# Patient Record
Sex: Female | Born: 1985 | Race: Black or African American | Hispanic: No | Marital: Married | Smoking: Never smoker
Health system: Southern US, Community
[De-identification: ages and names within clinical notes are randomized; demographics above are authoritative.]

## PROBLEM LIST (undated history)

## (undated) ENCOUNTER — Inpatient Hospital Stay (HOSPITAL_COMMUNITY): Payer: Self-pay

## (undated) DIAGNOSIS — R87629 Unspecified abnormal cytological findings in specimens from vagina: Secondary | ICD-10-CM

## (undated) DIAGNOSIS — J45909 Unspecified asthma, uncomplicated: Secondary | ICD-10-CM

## (undated) DIAGNOSIS — I10 Essential (primary) hypertension: Secondary | ICD-10-CM

## (undated) HISTORY — PX: TOOTH EXTRACTION: SUR596

---

## 2014-01-12 HISTORY — PX: COLPOSCOPY: SHX161

## 2014-09-11 DIAGNOSIS — I10 Essential (primary) hypertension: Secondary | ICD-10-CM | POA: Insufficient documentation

## 2014-11-27 DIAGNOSIS — D069 Carcinoma in situ of cervix, unspecified: Secondary | ICD-10-CM | POA: Insufficient documentation

## 2015-10-31 DIAGNOSIS — J4530 Mild persistent asthma, uncomplicated: Secondary | ICD-10-CM | POA: Insufficient documentation

## 2016-11-10 DIAGNOSIS — O0990 Supervision of high risk pregnancy, unspecified, unspecified trimester: Secondary | ICD-10-CM | POA: Insufficient documentation

## 2016-11-29 ENCOUNTER — Encounter (HOSPITAL_COMMUNITY): Payer: Self-pay | Admitting: Emergency Medicine

## 2016-11-29 ENCOUNTER — Emergency Department (HOSPITAL_COMMUNITY)
Admission: EM | Admit: 2016-11-29 | Discharge: 2016-11-29 | Disposition: A | Discharge status: Home / Self Care | Payer: Medicaid Other | Attending: Emergency Medicine | Admitting: Emergency Medicine

## 2016-11-29 ENCOUNTER — Other Ambulatory Visit: Payer: Self-pay

## 2016-11-29 DIAGNOSIS — O209 Hemorrhage in early pregnancy, unspecified: Secondary | ICD-10-CM | POA: Insufficient documentation

## 2016-11-29 DIAGNOSIS — O200 Threatened abortion: Secondary | ICD-10-CM

## 2016-11-29 DIAGNOSIS — Z3A12 12 weeks gestation of pregnancy: Secondary | ICD-10-CM | POA: Diagnosis not present

## 2016-11-29 DIAGNOSIS — Z79899 Other long term (current) drug therapy: Secondary | ICD-10-CM | POA: Diagnosis not present

## 2016-11-29 LAB — CBC WITH DIFFERENTIAL/PLATELET
BASOS PCT: 0 %
Basophils Absolute: 0 10*3/uL (ref 0.0–0.1)
EOS ABS: 0.1 10*3/uL (ref 0.0–0.7)
EOS PCT: 1 %
HCT: 38.6 % (ref 36.0–46.0)
HEMOGLOBIN: 13.5 g/dL (ref 12.0–15.0)
Lymphocytes Relative: 21 %
Lymphs Abs: 1.6 10*3/uL (ref 0.7–4.0)
MCH: 31.1 pg (ref 26.0–34.0)
MCHC: 35 g/dL (ref 30.0–36.0)
MCV: 88.9 fL (ref 78.0–100.0)
Monocytes Absolute: 0.9 10*3/uL (ref 0.1–1.0)
Monocytes Relative: 12 %
NEUTROS PCT: 66 %
Neutro Abs: 5.1 10*3/uL (ref 1.7–7.7)
PLATELETS: 270 10*3/uL (ref 150–400)
RBC: 4.34 MIL/uL (ref 3.87–5.11)
RDW: 13.4 % (ref 11.5–15.5)
WBC: 7.6 10*3/uL (ref 4.0–10.5)

## 2016-11-29 LAB — COMPREHENSIVE METABOLIC PANEL
ALBUMIN: 3.8 g/dL (ref 3.5–5.0)
ALK PHOS: 51 U/L (ref 38–126)
ALT: 19 U/L (ref 14–54)
ANION GAP: 7 (ref 5–15)
AST: 57 U/L — ABNORMAL HIGH (ref 15–41)
BUN: 7 mg/dL (ref 6–20)
CALCIUM: 9.2 mg/dL (ref 8.9–10.3)
CHLORIDE: 105 mmol/L (ref 101–111)
CO2: 21 mmol/L — AB (ref 22–32)
Creatinine, Ser: 0.99 mg/dL (ref 0.44–1.00)
GFR calc Af Amer: >60 mL/min (ref >60)
GFR calc non Af Amer: >60 mL/min (ref >60)
GLUCOSE: 78 mg/dL (ref 65–99)
Potassium: 5.5 mmol/L — ABNORMAL HIGH (ref 3.5–5.1)
SODIUM: 133 mmol/L — AB (ref 135–145)
Total Bilirubin: 1.2 mg/dL (ref 0.3–1.2)
Total Protein: 7.2 g/dL (ref 6.5–8.1)

## 2016-11-29 LAB — URINALYSIS, ROUTINE W REFLEX MICROSCOPIC
BILIRUBIN URINE: NEGATIVE
GLUCOSE, UA: NEGATIVE mg/dL
Ketones, ur: NEGATIVE mg/dL
LEUKOCYTES UA: NEGATIVE
NITRITE: NEGATIVE
PH: 6 (ref 5.0–8.0)
Protein, ur: 30 mg/dL — AB
SPECIFIC GRAVITY, URINE: 1.018 (ref 1.005–1.030)

## 2016-11-29 LAB — HCG, QUANTITATIVE, PREGNANCY: HCG, BETA CHAIN, QUANT, S: 110461 m[IU]/mL — AB (ref <5)

## 2016-11-29 LAB — I-STAT BETA HCG BLOOD, ED (MC, WL, AP ONLY): I-stat hCG, quantitative: >2000 m[IU]/mL — ABNORMAL HIGH (ref <5)

## 2016-11-29 MED ORDER — SODIUM CHLORIDE 0.9 % IV BOLUS (SEPSIS)
1000.0000 mL | Freq: Once | INTRAVENOUS | Status: AC
  Administered 2016-11-29: 1000 mL via INTRAVENOUS

## 2016-11-29 NOTE — Discharge Instructions (Signed)
Stay hydrated.   No sex until your bleeding stops.   Your HCG level is 110,461.   You need to see your OB doctor in 2 days for repeat HCG level   Return to ER if you have uncontrolled bleeding with clots, tissues, severe abdominal cramps.

## 2016-11-29 NOTE — ED Triage Notes (Signed)
Pt states she is [redacted] weeks pregnant and this evening when she went to the restroom she noticed she was having some vaginal bleeding  Pt states it was on the tissue when she wiped  Pt states she noticed it again here  States she had some pain in her back and cramping earlier but denies any now

## 2016-11-29 NOTE — ED Notes (Signed)
Pt has returned from restroom. Pt states "there is still blood". Pt was asked if  presently more than before, pt stated "it's the same".

## 2016-11-29 NOTE — ED Notes (Signed)
ED Provider at bedside. 

## 2016-11-29 NOTE — ED Provider Notes (Signed)
[name] DEPT Provider Note   CSN: [id number] Arrival date & time: 11/29/16  1829     History   Chief Complaint Chief Complaint  Patient presents with  . Vaginal Bleeding    HPI Laura Gould is a 31 y.o. female G1P0 [redacted] weeks pregnant here with vaginal bleeding.  Patient had spotting earlier today that resolved.  Patient states that she has been followed with OB at [facility] and has prenatal care and had normal OB US recently.   The history is provided by the patient.    History reviewed. No pertinent past medical history.  There are no active problems to display for this patient.   History reviewed. No pertinent surgical history.  OB History    Gravida Para Term Preterm AB Living   1             SAB TAB Ectopic Multiple Live Births                   Home Medications    Prior to Admission medications   Medication Sig Start Date End Date Taking? Authorizing Provider  acetaminophen (TYLENOL) 500 MG tablet Take 500 mg every 6 (six) hours as needed by mouth for moderate pain or headache.   Yes [provider]  albuterol (PROVENTIL HFA;VENTOLIN HFA) 108 (90 Base) MCG/ACT inhaler Inhale 1 puff every 6 (six) hours as needed into the lungs for wheezing or shortness of breath.   Yes [provider]  Prenatal Multivit-Min-Fe-FA (PRENATAL VITAMINS PO) Take 1 tablet daily by mouth.   Yes [provider]    Family History History reviewed. No pertinent family history.  Social History Social History   Tobacco Use  . Smoking status: Never Smoker  . Smokeless tobacco: Never Used  Substance Use Topics  . Alcohol use: No    Frequency: Never  . Drug use: No     Allergies   Patient has no known allergies.   Review of Systems Review of Systems  Genitourinary: Positive for vaginal bleeding.  All other systems reviewed and are negative.    Physical Exam Updated Vital Signs BP 133/87 (BP Location: Left  Arm)   Pulse 76   Temp 99 F (37.2 C) (Oral)   Resp 16   Ht 4\' 11"  (1.499 m)   Wt 70.3 kg (155 lb)   SpO2 100%   BMI 31.31 kg/m   Physical Exam  Constitutional: She is oriented to person, place, and time. She appears well-developed.  HENT:  Head: Normocephalic.  Mouth/Throat: Oropharynx is clear and moist.  Eyes: Conjunctivae and EOM are normal. Pupils are equal, round, and reactive to light.  Neck: Normal range of motion. Neck supple.  Cardiovascular: Normal rate, regular rhythm and normal heart sounds.  Pulmonary/Chest: Effort normal and breath sounds normal. No stridor. No respiratory distress. She has no wheezes.  Abdominal: Soft. Bowel sounds are normal.  Gravid uterus, nontender   Genitourinary:  Genitourinary Comments: Os closed, no CMT or adnexal or uterine tenderness   Musculoskeletal: Normal range of motion.  Neurological: She is alert and oriented to person, place, and time.  Skin: Skin is warm.  Psychiatric: She has a normal mood and affect.  Nursing note and vitals reviewed.    ED Treatments / Results  Labs (all labs ordered are listed, but only abnormal results are displayed) Labs Reviewed  COMPREHENSIVE METABOLIC PANEL - Abnormal; Notable for the following components:      Result Value  Sodium 133 (*)    Potassium 5.5 (*)    CO2 21 (*)    AST 57 (*)    All other components within normal limits  HCG, QUANTITATIVE, PREGNANCY - Abnormal; Notable for the following components:   hCG, Beta Chain, Quant, S 110,461 (*)    All other components within normal limits  URINALYSIS, ROUTINE W REFLEX MICROSCOPIC - Abnormal; Notable for the following components:   Color, Urine AMBER (*)    Hgb urine dipstick LARGE (*)    Protein, ur 30 (*)    Bacteria, UA RARE (*)    Squamous Epithelial / LPF 0-5 (*)    All other components within normal limits  I-STAT BETA HCG BLOOD, ED ([facility], [facility], [facility] ONLY) - Abnormal; Notable for the following components:   I-stat hCG, quantitative  >2,000.0 (*)    All other components within normal limits  CBC WITH DIFFERENTIAL/PLATELET    EKG  EKG Interpretation None       Radiology No results found.  Procedures Procedures (including critical care time)  EMERGENCY DEPARTMENT [place] PREGNANCY "Study: Limited Ultrasound of the Pelvis for Pregnancy"  INDICATIONS:Pregnancy(required) Multiple views of the uterus and pelvic cavity were obtained in real-time with a multi-frequency probe.  APPROACH:Transabdominal  PERFORMED BY: Myself IMAGES ARCHIVED?: Yes LIMITATIONS: Body habitus PREGNANCY FREE FLUID: None ADNEXAL FINDINGS:Left ovary not seen and Right ovary not seen GESTATIONAL AGE, ESTIMATE: 12 week 4 days FETAL HEART RATE: 164 INTERPRETATION: Intrauterine gestational sac noted, Yolk sac noted and Fetal pole present       Medications Ordered in ED Medications  sodium chloride 0.9 % bolus 1,000 mL (0 mLs Intravenous Stopped 11/29/16 2252)     Initial Impression / Assessment and Plan / ED Course  I have reviewed the triage vital signs and the nursing notes.  Pertinent labs & imaging results that were available during my care of the patient were reviewed by me and considered in my medical decision making (see chart for details).    Laura Gould is a 31 y.o. female here with vaginal bleeding. [redacted] weeks pregnant, bedside US showed nl FHR and good movement. Her blood type is B positive so doesn't need Rhogam. Will check CBC, HCG.   11:35 PM HCG is 110,000. Os is closed on exam. Likely threatened abortion in first trimester pregnancy. Patient's Hg stable. Expect some spotting for several days. Recommend pelvic rest. She needs to get repeat HCG at [facility] office in 2 days. Gave strict return precautions    Final Clinical Impressions(s) / ED Diagnoses   Final diagnoses:  None    ED Discharge Orders    None       Drenda Freeze, MD 11/29/16 2337

## 2016-11-29 NOTE — ED Notes (Signed)
Pt wanted to use the restroom before triage.

## 2017-01-12 NOTE — L&D Delivery Note (Signed)
Operative Delivery Note At  a viable female was delivered via .  Presentation: vertex; Position: Left,, Occiput,, Anterior; Station: +4. Kiwi vac assisted x1 pull with del of fetal head, nuchal cord x 3 with occult arm, easilt reduced with spont del of rest of infant. Upon del infant had stron cry, movement of all extremities and pinked up well  Verbal consent: obtained from patient.  Risks and benefits discussed in detail.  Risks include, but are not limited to the risks of anesthesia, bleeding, infection, damage to maternal tissues, fetal cephalhematoma.  There is also the risk of inability to effect vaginal delivery of the head, or shoulder dystocia that cannot be resolved by established maneuvers, leading to the need for emergency cesarean section.  APGAR:8 ,9 ; weight  .   Placenta status:spont ,shultz .   Cord: 3vc with the following complications: PPH .  Cord pH: n/a  Anesthesia:  epidural Instruments: kiwi vac Episiotomy:  none Lacerations:  2nd repaired by Dr. Tawni Levy Repair: 3.0 vicryl Est. Blood Loss (mL):  1200  Mom to postpartum.  Baby to Couplet care / Skin to Skin.  [name] 05/16/2017, 10:09 AM

## 2017-01-14 DIAGNOSIS — O43199 Other malformation of placenta, unspecified trimester: Secondary | ICD-10-CM | POA: Insufficient documentation

## 2017-02-04 ENCOUNTER — Encounter: Payer: Self-pay | Admitting: Obstetrics and Gynecology

## 2017-02-04 ENCOUNTER — Ambulatory Visit (INDEPENDENT_AMBULATORY_CARE_PROVIDER_SITE_OTHER): Payer: Medicaid Other | Admitting: Obstetrics and Gynecology

## 2017-02-04 VITALS — BP 135/85 | HR 93 | Wt 184.0 lb

## 2017-02-04 DIAGNOSIS — Z3402 Encounter for supervision of normal first pregnancy, second trimester: Secondary | ICD-10-CM

## 2017-02-04 DIAGNOSIS — O10912 Unspecified pre-existing hypertension complicating pregnancy, second trimester: Secondary | ICD-10-CM

## 2017-02-04 DIAGNOSIS — O10919 Unspecified pre-existing hypertension complicating pregnancy, unspecified trimester: Secondary | ICD-10-CM | POA: Insufficient documentation

## 2017-02-04 MED ORDER — ASPIRIN EC 81 MG PO TBEC: 81.0000 mg | DELAYED_RELEASE_TABLET | Freq: Every day | ORAL | 2 refills | Status: DC

## 2017-02-04 MED ORDER — PRENATAL VITAMINS 0.8 MG PO TABS: 1.0000 | ORAL_TABLET | Freq: Every day | ORAL | 12 refills | Status: DC

## 2017-02-04 NOTE — Progress Notes (Signed)
Patient is in the office for initial ob visit, pt recently married in Oct., unplanned pregnancy.

## 2017-02-04 NOTE — Progress Notes (Signed)
   PRENATAL VISIT NOTE  Subjective:  Laura Gould is a 32 y.o. G1P0 at [redacted]w[redacted]d being seen today for ongoing prenatal care. Patient is recently married and relovated to [place] from [place]. Patient with prenatal care in [place] (records requested). She is currently monitored for the following issues for this high-risk pregnancy and has Adenocarcinoma in situ (AIS) of uterine cervix; Marginal insertion of umbilical cord affecting management of mother; Supervision of normal first pregnancy; Essential hypertension; Mild persistent asthma without complication; and Chronic hypertension affecting pregnancy on their problem list.  Patient reports no complaints.  Contractions: Not present. Vag. Bleeding: None.   . Denies leaking of fluid.   The following portions of the patient's history were reviewed and updated as appropriate: allergies, current medications, past family history, past medical history, past social history, past surgical history and problem list. Problem list updated.  Objective:   Vitals:   02/04/17 1015  BP: 135/85  Pulse: 93  Weight: 184 lb (83.5 kg)    Fetal Status: Fetal Heart Rate (bpm): 150 Fundal Height: 22 cm       General:  Alert, oriented and cooperative. Patient is in no acute distress.  Skin: Skin is warm and dry. No rash noted.   Cardiovascular: Normal heart rate noted  Respiratory: Normal respiratory effort, no problems with respiration noted  Abdomen: Soft, gravid, appropriate for gestational age.  Pain/Pressure: Absent     Pelvic: Cervical exam deferred        Extremities: Normal range of motion.  Edema: Trace  Mental Status:  Normal mood and affect. Normal behavior. Normal judgment and thought content.   Assessment and Plan:  Pregnancy: G1P0 at [redacted]w[redacted]d  1. Encounter for supervision of normal first pregnancy in second trimester Patient is doing well Prenatal records requested (particularly of ultrasounds)  2. Chronic hypertension affecting  pregnancy Discussed the benefits of daily ASA Will monitor BP closely for need of antihypertensive (Patient was previously on a thiazide) Will schedule growth ultrasound at 28 weeks  Preterm labor symptoms and general obstetric precautions including but not limited to vaginal bleeding, contractions, leaking of fluid and fetal movement were reviewed in detail with the patient. Please refer to After Visit Summary for other counseling recommendations.  Return in about 4 weeks (around 03/04/2017) for ROB.   Mora Bellman, MD

## 2017-02-08 ENCOUNTER — Encounter (HOSPITAL_COMMUNITY): Payer: Self-pay

## 2017-02-10 ENCOUNTER — Encounter (HOSPITAL_COMMUNITY): Payer: Self-pay

## 2017-02-10 ENCOUNTER — Other Ambulatory Visit (HOSPITAL_COMMUNITY): Payer: Self-pay | Admitting: *Deleted

## 2017-02-10 ENCOUNTER — Ambulatory Visit (HOSPITAL_COMMUNITY)
Admission: RE | Admit: 2017-02-10 | Discharge: 2017-02-10 | Disposition: A | Discharge status: Home / Self Care | Payer: Medicaid Other | Source: Ambulatory Visit | Attending: Obstetrics and Gynecology | Admitting: Obstetrics and Gynecology

## 2017-02-10 DIAGNOSIS — D251 Intramural leiomyoma of uterus: Secondary | ICD-10-CM | POA: Insufficient documentation

## 2017-02-10 DIAGNOSIS — O3412 Maternal care for benign tumor of corpus uteri, second trimester: Secondary | ICD-10-CM | POA: Insufficient documentation

## 2017-02-10 DIAGNOSIS — Z3A22 22 weeks gestation of pregnancy: Secondary | ICD-10-CM | POA: Diagnosis not present

## 2017-02-10 DIAGNOSIS — O99212 Obesity complicating pregnancy, second trimester: Secondary | ICD-10-CM | POA: Insufficient documentation

## 2017-02-10 DIAGNOSIS — O10012 Pre-existing essential hypertension complicating pregnancy, second trimester: Secondary | ICD-10-CM | POA: Insufficient documentation

## 2017-02-10 DIAGNOSIS — Z3689 Encounter for other specified antenatal screening: Secondary | ICD-10-CM | POA: Insufficient documentation

## 2017-02-10 DIAGNOSIS — D252 Subserosal leiomyoma of uterus: Secondary | ICD-10-CM | POA: Diagnosis not present

## 2017-02-10 DIAGNOSIS — E669 Obesity, unspecified: Secondary | ICD-10-CM | POA: Diagnosis not present

## 2017-02-10 DIAGNOSIS — O10919 Unspecified pre-existing hypertension complicating pregnancy, unspecified trimester: Secondary | ICD-10-CM

## 2017-02-10 HISTORY — DX: Unspecified abnormal cytological findings in specimens from vagina: R87.629

## 2017-02-17 ENCOUNTER — Ambulatory Visit (INDEPENDENT_AMBULATORY_CARE_PROVIDER_SITE_OTHER): Payer: Medicaid Other | Admitting: Obstetrics & Gynecology

## 2017-02-17 ENCOUNTER — Telehealth: Payer: Self-pay

## 2017-02-17 VITALS — BP 128/86 | HR 97 | Wt 189.3 lb

## 2017-02-17 DIAGNOSIS — R040 Epistaxis: Secondary | ICD-10-CM

## 2017-02-17 DIAGNOSIS — Z3402 Encounter for supervision of normal first pregnancy, second trimester: Secondary | ICD-10-CM

## 2017-02-17 NOTE — Telephone Encounter (Signed)
TC to pt regarding message  Pt cc: nosebleeds  pt advised to invest in a humidifier /saline nasal sprays Pt states she has appt today to discuss further,

## 2017-02-17 NOTE — Progress Notes (Signed)
Nosebleed x3   PRENATAL VISIT NOTE  Subjective:  Laura Gould is a 32 y.o. G1P0 at [redacted]w[redacted]d being seen today for ongoing prenatal care.  She is currently monitored for the following issues for this high-risk pregnancy and has Adenocarcinoma in situ (AIS) of uterine cervix; Marginal insertion of umbilical cord affecting management of mother; Supervision of normal first pregnancy; Essential hypertension; Mild persistent asthma without complication; and Chronic hypertension affecting pregnancy on their problem list.  Patient reports mild nosebleed three occasions.  Contractions: Not present. Vag. Bleeding: None.  Movement: Present. Denies leaking of fluid.   The following portions of the patient's history were reviewed and updated as appropriate: allergies, current medications, past family history, past medical history, past social history, past surgical history and problem list. Problem list updated.  Objective:   Vitals:   02/17/17 1111  BP: 128/86  Pulse: 97  Weight: 189 lb 4.8 oz (85.9 kg)    Fetal Status: Fetal Heart Rate (bpm): 150   Movement: Present     General:  Alert, oriented and cooperative. Patient is in no acute distress.  Skin: Skin is warm and dry. No rash noted.   Cardiovascular: Normal heart rate noted  Respiratory: Normal respiratory effort, no problems with respiration noted  Abdomen: Soft, gravid, appropriate for gestational age.  Pain/Pressure: Absent     Pelvic: Cervical exam deferred        Extremities: Normal range of motion.  Edema: Trace  Mental Status:  Normal mood and affect. Normal behavior. Normal judgment and thought content.   Assessment and Plan:  Pregnancy: G1P0 at [redacted]w[redacted]d  1. Frequent nosebleeds Benign pregnancy symptom  2. Encounter for supervision of normal first pregnancy in second trimester Normal range BP  Preterm labor symptoms and general obstetric precautions including but not limited to vaginal bleeding, contractions, leaking  of fluid and fetal movement were reviewed in detail with the patient. Please refer to After Visit Summary for other counseling recommendations.  Return in about 3 weeks (around 03/10/2017) for 2 hr gtt.   Emeterio Reeve, MD

## 2017-02-17 NOTE — Patient Instructions (Addendum)
Nosebleed, Adult A nosebleed is when blood comes out of the nose. Nosebleeds are common. Usually, they are not a sign of a serious condition. Nosebleeds can happen if a small blood vessel in your nose starts to bleed or if the lining of your nose (mucous membrane) cracks. They are commonly caused by:  Allergies.  Colds.  Picking your nose.  Blowing your nose too hard.  An injury from sticking an object into your nose or getting hit in the nose.  Dry or cold air.  Less common causes of nosebleeds include:  Toxic fumes.  Something abnormal in the nose or in the air-filled spaces in the bones of the face (sinuses).  Growths in the nose, such as polyps.  Medicines or conditions that cause blood to clot slowly.  Certain illnesses or procedures that irritate or dry out the nasal passages.  Follow these instructions at home: When you have a nosebleed:  Sit down and tilt your head slightly forward.  Use a clean towel or tissue to pinch your nostrils under the bony part of your nose. After 10 minutes, let go of your nose and see if bleeding starts again. Do not release pressure before that time. If there is still bleeding, repeat the pinching and holding for 10 minutes until the bleeding stops.  Do not place tissues or gauze in the nose to stop bleeding.  Avoid lying down and avoid tilting your head backward. That may make blood collect in the throat and cause gagging or coughing.  Use a nasal spray decongestant to help with a nosebleed as told by your health care provider.  Do not use petroleum jelly or mineral oil in your nose. It can drip into your lungs. After a nosebleed:  Avoid blowing your nose or sniffing for a number of hours.  Avoid straining, lifting, or bending at the waist for several days. You may resume other normal activities as you are able.  Use saline spray or a humidifier as told by your health care provider.  Aspirinand blood thinners make bleeding more  likely. If you are prescribed these medicines and you suffer from nosebleeds: ? Ask your health care provider if you should stop taking the medicines or if you should adjust the dose. ? Do not stop taking medicines that your health care provider has recommended unless told by your health care provider.  If your nosebleed was caused by dry mucous membranes, use over-the-counter saline nasal spray or gel. This will keep the mucous membranes moist and allow them to heal. If you must use a lubricant: ? Choose one that is water-soluble. ? Use only as much as you need and use it only as often as needed. ? Do not lie down until several hours after you use it. Contact a health care provider if:  You have a fever.  You get nosebleeds often or more often than usual.  You bruise very easily.  You have a nosebleed from having something stuck in your nose.  You have bleeding in your mouth.  You vomit or cough up brown material.  You have a nosebleed after you start a new medicine. Get help right away if:  You have a nosebleed after a fall or a head injury.  Your nosebleed does not go away after 20 minutes.  You feel dizzy or weak.  You have unusual bleeding from other parts of your body.  You have unusual bruising on other parts of your body.  You become sweaty.    You vomit blood. This information is not intended to replace advice given to you by your health care provider. Make sure you discuss any questions you have with your health care provider. Document Released: 10/08/2004 Document Revised: 08/29/2015 Document Reviewed: 07/16/2015 Elsevier Interactive Patient Education  2018 [employer]  

## 2017-02-24 ENCOUNTER — Encounter: Payer: Medicaid Other | Admitting: Obstetrics

## 2017-03-04 ENCOUNTER — Encounter: Payer: Medicaid Other | Admitting: Obstetrics

## 2017-03-10 ENCOUNTER — Ambulatory Visit (HOSPITAL_COMMUNITY): Payer: Medicaid Other

## 2017-03-15 ENCOUNTER — Encounter (HOSPITAL_COMMUNITY): Payer: Self-pay

## 2017-03-15 ENCOUNTER — Inpatient Hospital Stay (EMERGENCY_DEPARTMENT_HOSPITAL): Payer: Medicaid Other

## 2017-03-15 ENCOUNTER — Inpatient Hospital Stay (HOSPITAL_COMMUNITY)
Admission: AD | Admit: 2017-03-15 | Discharge: 2017-03-15 | Disposition: A | Discharge status: Home / Self Care | Payer: Medicaid Other | Source: Ambulatory Visit | Attending: Obstetrics & Gynecology | Admitting: Obstetrics & Gynecology

## 2017-03-15 ENCOUNTER — Telehealth: Payer: Self-pay

## 2017-03-15 ENCOUNTER — Other Ambulatory Visit: Payer: Self-pay

## 2017-03-15 DIAGNOSIS — Z7982 Long term (current) use of aspirin: Secondary | ICD-10-CM | POA: Diagnosis not present

## 2017-03-15 DIAGNOSIS — O10912 Unspecified pre-existing hypertension complicating pregnancy, second trimester: Secondary | ICD-10-CM

## 2017-03-15 DIAGNOSIS — O1202 Gestational edema, second trimester: Secondary | ICD-10-CM | POA: Insufficient documentation

## 2017-03-15 DIAGNOSIS — M7989 Other specified soft tissue disorders: Secondary | ICD-10-CM | POA: Diagnosis not present

## 2017-03-15 DIAGNOSIS — O10012 Pre-existing essential hypertension complicating pregnancy, second trimester: Secondary | ICD-10-CM | POA: Diagnosis not present

## 2017-03-15 DIAGNOSIS — Z3A27 27 weeks gestation of pregnancy: Secondary | ICD-10-CM

## 2017-03-15 DIAGNOSIS — R609 Edema, unspecified: Secondary | ICD-10-CM

## 2017-03-15 DIAGNOSIS — O10919 Unspecified pre-existing hypertension complicating pregnancy, unspecified trimester: Secondary | ICD-10-CM

## 2017-03-15 LAB — CBC
HCT: 35.7 % — ABNORMAL LOW (ref 36.0–46.0)
Hemoglobin: 12.5 g/dL (ref 12.0–15.0)
MCH: 31.6 pg (ref 26.0–34.0)
MCHC: 35 g/dL (ref 30.0–36.0)
MCV: 90.2 fL (ref 78.0–100.0)
PLATELETS: 314 10*3/uL (ref 150–400)
RBC: 3.96 MIL/uL (ref 3.87–5.11)
RDW: 13.6 % (ref 11.5–15.5)
WBC: 7.4 10*3/uL (ref 4.0–10.5)

## 2017-03-15 LAB — COMPREHENSIVE METABOLIC PANEL
ALT: 27 U/L (ref 14–54)
AST: 28 U/L (ref 15–41)
Albumin: 3.3 g/dL — ABNORMAL LOW (ref 3.5–5.0)
Alkaline Phosphatase: 81 U/L (ref 38–126)
Anion gap: 8 (ref 5–15)
BUN: 6 mg/dL (ref 6–20)
CHLORIDE: 105 mmol/L (ref 101–111)
CO2: 22 mmol/L (ref 22–32)
CREATININE: 0.53 mg/dL (ref 0.44–1.00)
Calcium: 9 mg/dL (ref 8.9–10.3)
GFR calc non Af Amer: >60 mL/min (ref >60)
Glucose, Bld: 86 mg/dL (ref 65–99)
Potassium: 3.3 mmol/L — ABNORMAL LOW (ref 3.5–5.1)
SODIUM: 135 mmol/L (ref 135–145)
Total Bilirubin: 0.7 mg/dL (ref 0.3–1.2)
Total Protein: 6.6 g/dL (ref 6.5–8.1)

## 2017-03-15 LAB — URINALYSIS, ROUTINE W REFLEX MICROSCOPIC
Bilirubin Urine: NEGATIVE
Glucose, UA: NEGATIVE mg/dL
Hgb urine dipstick: NEGATIVE
KETONES UR: 20 mg/dL — AB
LEUKOCYTES UA: NEGATIVE
NITRITE: NEGATIVE
PH: 7 (ref 5.0–8.0)
PROTEIN: NEGATIVE mg/dL
Specific Gravity, Urine: 1.011 (ref 1.005–1.030)

## 2017-03-15 LAB — PROTEIN / CREATININE RATIO, URINE
Creatinine, Urine: 84 mg/dL
PROTEIN CREATININE RATIO: 0.1 mg/mg{creat} (ref 0.00–0.15)
TOTAL PROTEIN, URINE: 8 mg/dL

## 2017-03-15 MED ORDER — COTTON SOCKS/MATERNITY MISC: 0 refills | Status: DC

## 2017-03-15 NOTE — MAU Note (Signed)
Pt presents to MAU with c/o leg and feet swelling that noticed a week ago. Pt denies VB and LOF. +FM

## 2017-03-15 NOTE — Discharge Instructions (Signed)

## 2017-03-15 NOTE — Telephone Encounter (Addendum)
TC from pt c/o continuous pain and swelling in both legs. She said sometimes it's difficult for her to walk. Pt asked if OTC Potassium tabs are safe to take during pregnancy for leg pain. She took one tab already. Consulted with Dr. Jodi Mourning. He confirms Potassium is not safe during pregnancy and she should report to [facility] to r/o blood clot. Pt agrees.

## 2017-03-15 NOTE — Progress Notes (Signed)
Bilateral lower extremity venous duplex completed. Technically difficult due to swelling. No obvious evience of DVT, superficial thrombosis, or Baker's cyst. Toma Copier, RVS 03/15/2017 6:35 PM

## 2017-03-15 NOTE — MAU Provider Note (Signed)
History     CSN: [id number]  Arrival date and time: 03/15/17 1448   First Provider Initiated Contact with Patient 03/15/17 1528      Chief Complaint  Patient presents with  . Leg Swelling   G1 @27 .2 wks here with LE edema and pain. Reports swelling started in her feet about 1 month ago. It initially improved with elevation and compression then worsened about 2 weeks ago making it harder to put on clothes and walk. The edema also moved up in to legs and thighs. Denies CP or SOB. No HA, visual disturbances, or epigastric pain. Reports good FM. No VB, LOF, or ctx. Her pregnancy is complicated by [facility] not on meds.    OB History    Gravida Para Term Preterm AB Living   1             SAB TAB Ectopic Multiple Live Births                  Past Medical History:  Diagnosis Date  . Vaginal Pap smear, abnormal     Past Surgical History:  Procedure Laterality Date  . COLPOSCOPY  2016  . TOOTH EXTRACTION      History reviewed. No pertinent family history.  Social History   Tobacco Use  . Smoking status: Never Smoker  . Smokeless tobacco: Never Used  Substance Use Topics  . Alcohol use: No    Frequency: Never  . Drug use: No    Allergies: No Known Allergies  Medications Prior to Admission  Medication Sig Dispense Refill Last Dose  . acetaminophen (TYLENOL) 500 MG tablet Take 500 mg every 6 (six) hours as needed by mouth for moderate pain or headache.   Past Week at Unknown time  . albuterol (PROVENTIL HFA;VENTOLIN HFA) 108 (90 Base) MCG/ACT inhaler Inhale 1 puff every 6 (six) hours as needed into the lungs for wheezing or shortness of breath.   Past Week at Unknown time  . aspirin EC 81 MG tablet Take 1 tablet (81 mg total) by mouth daily. Take after 12 weeks for prevention of preeclampsia later in pregnancy 300 tablet 2 03/15/2017 at Unknown time  . Prenatal Multivit-Min-Fe-FA (PRENATAL VITAMINS) 0.8 MG tablet Take 1 tablet by mouth daily. 30 tablet 12 03/15/2017 at Unknown time     Review of Systems  Eyes: Negative for visual disturbance.  Respiratory: Negative for chest tightness and shortness of breath.   Cardiovascular: Positive for leg swelling. Negative for chest pain.  Gastrointestinal: Negative for abdominal pain.  Genitourinary: Negative for vaginal bleeding.  Neurological: Negative for headaches.   Physical Exam   Blood pressure 136/75, pulse 88, temperature 98 F (36.7 C), temperature source Oral, weight 197 lb 6.4 oz (89.5 kg), last menstrual period 09/05/2016, SpO2 100 %. Patient Vitals for the past 24 hrs:  BP Temp Temp src Pulse Resp SpO2 Weight  03/15/17 1646 136/75 - - 88 - - -  03/15/17 1631 137/80 - - 91 - - -  03/15/17 1616 130/70 - - 92 - - -  03/15/17 1601 129/73 - - 92 - - -  03/15/17 1545 (!) 141/80 - - 98 - 100 % -  03/15/17 1534 140/79 - - 97 - - -  03/15/17 1523 137/79 - - 95 - - -  03/15/17 1505 (!) 162/86 98 F (36.7 C) Oral 95 - 100 % 197 lb 6.4 oz (89.5 kg)    Physical Exam  Constitutional: She is oriented to person, place, and  time. She appears well-developed and well-nourished. No distress.  HENT:  Head: Atraumatic.  Neck: Normal range of motion.  Cardiovascular: Normal rate, regular rhythm and normal heart sounds.  Respiratory: Effort normal and breath sounds normal. No respiratory distress. She has no wheezes. She has no rales.  GI: Soft. She exhibits no distension. There is no tenderness.  gravid  Musculoskeletal: Normal range of motion. She exhibits edema (2+ pitting edema to legs, thighs, and abdomen). She exhibits no deformity.  Left calf measures 44.5cm Right calf measures 44.5cm No erythema or cords  Neurological: She is alert and oriented to person, place, and time.  Skin: Skin is warm and dry.  Psychiatric: She has a normal mood and affect.  EFM: 145 bpm, mod variability, + accels, occ. variable decels Toco: rare irritability  Results for orders placed or performed during the hospital encounter of  03/15/17 (from the past 24 hour(s))  Protein / creatinine ratio, urine     Status: None   Collection Time: 03/15/17  1:13 PM  Result Value Ref Range   Creatinine, Urine 84.00 mg/dL   Total Protein, Urine 8 mg/dL   Protein Creatinine Ratio 0.10 0.00 - 0.15 mg/mg[Cre]  Urinalysis, Routine w reflex microscopic     Status: Abnormal   Collection Time: 03/15/17  1:13 PM  Result Value Ref Range   Color, Urine YELLOW YELLOW   APPearance CLEAR CLEAR   Specific Gravity, Urine 1.011 1.005 - 1.030   pH 7.0 5.0 - 8.0   Glucose, UA NEGATIVE NEGATIVE mg/dL   Hgb urine dipstick NEGATIVE NEGATIVE   Bilirubin Urine NEGATIVE NEGATIVE   Ketones, ur 20 (A) NEGATIVE mg/dL   Protein, ur NEGATIVE NEGATIVE mg/dL   Nitrite NEGATIVE NEGATIVE   Leukocytes, UA NEGATIVE NEGATIVE  CBC     Status: Abnormal   Collection Time: 03/15/17  3:35 PM  Result Value Ref Range   WBC 7.4 4.0 - 10.5 K/uL   RBC 3.96 3.87 - 5.11 MIL/uL   Hemoglobin 12.5 12.0 - 15.0 g/dL   HCT 35.7 (L) 36.0 - 46.0 %   MCV 90.2 78.0 - 100.0 fL   MCH 31.6 26.0 - 34.0 pg   MCHC 35.0 30.0 - 36.0 g/dL   RDW 13.6 11.5 - 15.5 %   Platelets 314 150 - 400 K/uL  Comprehensive metabolic panel     Status: Abnormal   Collection Time: 03/15/17  3:35 PM  Result Value Ref Range   Sodium 135 135 - 145 mmol/L   Potassium 3.3 (L) 3.5 - 5.1 mmol/L   Chloride 105 101 - 111 mmol/L   CO2 22 22 - 32 mmol/L   Glucose, Bld 86 65 - 99 mg/dL   BUN 6 6 - 20 mg/dL   Creatinine, Ser 0.53 0.44 - 1.00 mg/dL   Calcium 9.0 8.9 - 10.3 mg/dL   Total Protein 6.6 6.5 - 8.1 g/dL   Albumin 3.3 (L) 3.5 - 5.0 g/dL   AST 28 15 - 41 U/L   ALT 27 14 - 54 U/L   Alkaline Phosphatase 81 38 - 126 U/L   Total Bilirubin 0.7 0.3 - 1.2 mg/dL   GFR calc non Af Amer >60 >60 mL/min   GFR calc Af Amer >60 >60 mL/min   Anion gap 8 5 - 15   MAU Course  Procedures  MDM Labs ordered and reviewed. No evidence of superimposed pre-e. Dopplers negative per tech. Will Rx for maternity  compressions. Stable for discharge home.   Assessment and  Plan   1. [redacted] weeks gestation of pregnancy   2. Dependent edema   3. Chronic hypertension affecting pregnancy    Discharge home Follow up in OB office as scheduled Rx maternity compressions  Allergies as of 03/15/2017   No Known Allergies     Medication List    TAKE these medications   acetaminophen 500 MG tablet Commonly known as:  TYLENOL Take 500 mg every 6 (six) hours as needed by mouth for moderate pain or headache.   albuterol 108 (90 Base) MCG/ACT inhaler Commonly known as:  PROVENTIL HFA;VENTOLIN HFA Inhale 1 puff every 6 (six) hours as needed into the lungs for wheezing or shortness of breath.   aspirin EC 81 MG tablet Take 1 tablet (81 mg total) by mouth daily. Take after 12 weeks for prevention of preeclampsia later in pregnancy   Cotton Socks/Maternity Misc Maternity compression hose 20/30 mmhg   Prenatal Vitamins 0.8 MG tablet Take 1 tablet by mouth daily.      Julianne Handler, CNM 03/15/2017, 6:32 PM

## 2017-03-17 ENCOUNTER — Ambulatory Visit (HOSPITAL_COMMUNITY): Payer: Medicaid Other

## 2017-03-17 ENCOUNTER — Other Ambulatory Visit: Payer: Self-pay

## 2017-03-17 ENCOUNTER — Other Ambulatory Visit: Payer: Medicaid Other

## 2017-03-17 ENCOUNTER — Encounter: Payer: Self-pay | Admitting: Obstetrics

## 2017-03-17 ENCOUNTER — Ambulatory Visit (INDEPENDENT_AMBULATORY_CARE_PROVIDER_SITE_OTHER): Payer: Medicaid Other | Admitting: Obstetrics

## 2017-03-17 VITALS — BP 138/84 | HR 90 | Wt 198.0 lb

## 2017-03-17 DIAGNOSIS — O10912 Unspecified pre-existing hypertension complicating pregnancy, second trimester: Secondary | ICD-10-CM

## 2017-03-17 DIAGNOSIS — R600 Localized edema: Secondary | ICD-10-CM

## 2017-03-17 DIAGNOSIS — Z23 Encounter for immunization: Secondary | ICD-10-CM | POA: Diagnosis not present

## 2017-03-17 DIAGNOSIS — O0990 Supervision of high risk pregnancy, unspecified, unspecified trimester: Secondary | ICD-10-CM

## 2017-03-17 DIAGNOSIS — O0992 Supervision of high risk pregnancy, unspecified, second trimester: Secondary | ICD-10-CM

## 2017-03-17 DIAGNOSIS — O10919 Unspecified pre-existing hypertension complicating pregnancy, unspecified trimester: Secondary | ICD-10-CM

## 2017-03-17 NOTE — Progress Notes (Signed)
Subjective:  Laura Gould is a 32 y.o. G1P0 at [redacted]w[redacted]d being seen today for ongoing prenatal care.  She is currently monitored for the following issues for this high-risk pregnancy and has Adenocarcinoma in situ (AIS) of uterine cervix; Marginal insertion of umbilical cord affecting management of mother; Supervision of normal first pregnancy; Essential hypertension; Mild persistent asthma without complication; and Chronic hypertension affecting pregnancy on their problem list.  Patient reports SWELLING OF LE'S.  Contractions: Not present. Vag. Bleeding: None.  Movement: Present. Denies leaking of fluid.   The following portions of the patient's history were reviewed and updated as appropriate: allergies, current medications, past family history, past medical history, past social history, past surgical history and problem list. Problem list updated.  Objective:   Vitals:   03/17/17 0918 03/17/17 0922  BP: (!) 136/93 138/84  Pulse: 92 90  Weight: 198 lb (89.8 kg)     Fetal Status:     Movement: Present     General:  Alert, oriented and cooperative. Patient is in no acute distress.  Skin: Skin is warm and dry. No rash noted.   Cardiovascular: Normal heart rate noted  Respiratory: Normal respiratory effort, no problems with respiration noted  Abdomen: Soft, gravid, appropriate for gestational age. Pain/Pressure: Absent     Pelvic:  Cervical exam deferred        Extremities: Normal range of motion.  Edema: Other (Comment)  Mental Status: Normal mood and affect. Normal behavior. Normal judgment and thought content.   Urinalysis:      Assessment and Plan:  Pregnancy: G1P0 at [redacted]w[redacted]d  1. Supervision of high risk pregnancy, antepartum Rx: - Glucose Tolerance, 2 Hours w/1 Hour - CBC - HIV antibody (with reflex) - RPR  2. Chronic hypertension affecting pregnancy - stable currently - ultrasound for interval growth monthly  3. Lower extremity edema - vascular support hose  recommended and ordered   Preterm labor symptoms and general obstetric precautions including but not limited to vaginal bleeding, contractions, leaking of fluid and fetal movement were reviewed in detail with the patient. Please refer to After Visit Summary for other counseling recommendations.  Return in about 2 weeks (around 03/31/2017) for ROB.   Shelly Bombard, MD

## 2017-03-17 NOTE — Addendum Note (Signed)
Addended by: Tamela Oddi on: 03/17/2017 11:31 AM   Modules accepted: Orders

## 2017-03-17 NOTE — Progress Notes (Addendum)
ROB/GTT.  C/o swollen legs x 7 days and pain/tightness in both legs 10/10. TDAP vaccine given in left deltoid.Tubal Papers signed.

## 2017-03-18 LAB — CBC
Hematocrit: 34.8 % (ref 34.0–46.6)
Hemoglobin: 11.6 g/dL (ref 11.1–15.9)
MCH: 30.4 pg (ref 26.6–33.0)
MCHC: 33.3 g/dL (ref 31.5–35.7)
MCV: 91 fL (ref 79–97)
PLATELETS: 315 10*3/uL (ref 150–379)
RBC: 3.82 x10E6/uL (ref 3.77–5.28)
RDW: 14.7 % (ref 12.3–15.4)
WBC: 5.8 10*3/uL (ref 3.4–10.8)

## 2017-03-18 LAB — GLUCOSE TOLERANCE, 2 HOURS W/ 1HR
GLUCOSE, 1 HOUR: 104 mg/dL (ref 65–179)
GLUCOSE, FASTING: 63 mg/dL — AB (ref 65–91)
Glucose, 2 hour: 93 mg/dL (ref 65–152)

## 2017-03-18 LAB — HIV ANTIBODY (ROUTINE TESTING W REFLEX): HIV SCREEN 4TH GENERATION: NONREACTIVE

## 2017-03-18 LAB — RPR: RPR Ser Ql: NONREACTIVE

## 2017-03-19 ENCOUNTER — Other Ambulatory Visit (HOSPITAL_COMMUNITY): Payer: Self-pay | Admitting: Maternal & Fetal Medicine

## 2017-03-19 ENCOUNTER — Ambulatory Visit (HOSPITAL_COMMUNITY)
Admission: RE | Admit: 2017-03-19 | Discharge: 2017-03-19 | Disposition: A | Discharge status: Home / Self Care | Payer: Medicaid Other | Source: Ambulatory Visit | Attending: Obstetrics and Gynecology | Admitting: Obstetrics and Gynecology

## 2017-03-19 DIAGNOSIS — O99212 Obesity complicating pregnancy, second trimester: Secondary | ICD-10-CM | POA: Diagnosis not present

## 2017-03-19 DIAGNOSIS — O10919 Unspecified pre-existing hypertension complicating pregnancy, unspecified trimester: Secondary | ICD-10-CM

## 2017-03-19 DIAGNOSIS — Z3689 Encounter for other specified antenatal screening: Secondary | ICD-10-CM | POA: Diagnosis present

## 2017-03-19 DIAGNOSIS — Z362 Encounter for other antenatal screening follow-up: Secondary | ICD-10-CM

## 2017-03-19 DIAGNOSIS — Z3A27 27 weeks gestation of pregnancy: Secondary | ICD-10-CM

## 2017-03-19 DIAGNOSIS — E669 Obesity, unspecified: Secondary | ICD-10-CM | POA: Insufficient documentation

## 2017-03-19 DIAGNOSIS — O10019 Pre-existing essential hypertension complicating pregnancy, unspecified trimester: Secondary | ICD-10-CM

## 2017-03-19 DIAGNOSIS — O10012 Pre-existing essential hypertension complicating pregnancy, second trimester: Secondary | ICD-10-CM | POA: Diagnosis not present

## 2017-03-31 ENCOUNTER — Inpatient Hospital Stay (HOSPITAL_COMMUNITY)
Admission: AD | Admit: 2017-03-31 | Discharge: 2017-03-31 | Disposition: A | Discharge status: Home / Self Care | Payer: Medicaid Other | Source: Ambulatory Visit | Attending: Obstetrics and Gynecology | Admitting: Obstetrics and Gynecology

## 2017-03-31 ENCOUNTER — Other Ambulatory Visit: Payer: Self-pay

## 2017-03-31 ENCOUNTER — Encounter (HOSPITAL_COMMUNITY): Payer: Self-pay | Admitting: *Deleted

## 2017-03-31 ENCOUNTER — Encounter: Payer: Self-pay | Admitting: Obstetrics and Gynecology

## 2017-03-31 ENCOUNTER — Ambulatory Visit (INDEPENDENT_AMBULATORY_CARE_PROVIDER_SITE_OTHER): Payer: Medicaid Other | Admitting: Obstetrics and Gynecology

## 2017-03-31 VITALS — BP 129/85 | HR 88 | Wt 193.9 lb

## 2017-03-31 DIAGNOSIS — Z3A29 29 weeks gestation of pregnancy: Secondary | ICD-10-CM | POA: Diagnosis not present

## 2017-03-31 DIAGNOSIS — Z3689 Encounter for other specified antenatal screening: Secondary | ICD-10-CM | POA: Diagnosis not present

## 2017-03-31 DIAGNOSIS — Z79899 Other long term (current) drug therapy: Secondary | ICD-10-CM | POA: Insufficient documentation

## 2017-03-31 DIAGNOSIS — O10913 Unspecified pre-existing hypertension complicating pregnancy, third trimester: Secondary | ICD-10-CM | POA: Diagnosis not present

## 2017-03-31 DIAGNOSIS — Z7982 Long term (current) use of aspirin: Secondary | ICD-10-CM | POA: Insufficient documentation

## 2017-03-31 DIAGNOSIS — O1203 Gestational edema, third trimester: Secondary | ICD-10-CM

## 2017-03-31 DIAGNOSIS — O10919 Unspecified pre-existing hypertension complicating pregnancy, unspecified trimester: Secondary | ICD-10-CM

## 2017-03-31 DIAGNOSIS — O43199 Other malformation of placenta, unspecified trimester: Secondary | ICD-10-CM

## 2017-03-31 DIAGNOSIS — O10912 Unspecified pre-existing hypertension complicating pregnancy, second trimester: Secondary | ICD-10-CM | POA: Diagnosis not present

## 2017-03-31 DIAGNOSIS — D069 Carcinoma in situ of cervix, unspecified: Secondary | ICD-10-CM

## 2017-03-31 DIAGNOSIS — O1200 Gestational edema, unspecified trimester: Secondary | ICD-10-CM | POA: Insufficient documentation

## 2017-03-31 DIAGNOSIS — Z3403 Encounter for supervision of normal first pregnancy, third trimester: Secondary | ICD-10-CM

## 2017-03-31 HISTORY — DX: Essential (primary) hypertension: I10

## 2017-03-31 LAB — COMPREHENSIVE METABOLIC PANEL
ALK PHOS: 88 U/L (ref 38–126)
ALT: 22 U/L (ref 14–54)
AST: 22 U/L (ref 15–41)
Albumin: 3.2 g/dL — ABNORMAL LOW (ref 3.5–5.0)
Anion gap: 8 (ref 5–15)
CALCIUM: 9 mg/dL (ref 8.9–10.3)
CHLORIDE: 105 mmol/L (ref 101–111)
CO2: 24 mmol/L (ref 22–32)
CREATININE: 0.5 mg/dL (ref 0.44–1.00)
Glucose, Bld: 82 mg/dL (ref 65–99)
Potassium: 3.4 mmol/L — ABNORMAL LOW (ref 3.5–5.1)
Sodium: 137 mmol/L (ref 135–145)
Total Bilirubin: 0.4 mg/dL (ref 0.3–1.2)
Total Protein: 6.6 g/dL (ref 6.5–8.1)

## 2017-03-31 LAB — CBC
HCT: 35.2 % — ABNORMAL LOW (ref 36.0–46.0)
HEMOGLOBIN: 11.9 g/dL — AB (ref 12.0–15.0)
MCH: 31.1 pg (ref 26.0–34.0)
MCHC: 33.8 g/dL (ref 30.0–36.0)
MCV: 91.9 fL (ref 78.0–100.0)
Platelets: 365 10*3/uL (ref 150–400)
RBC: 3.83 MIL/uL — AB (ref 3.87–5.11)
RDW: 13.7 % (ref 11.5–15.5)
WBC: 5.7 10*3/uL (ref 4.0–10.5)

## 2017-03-31 LAB — PROTEIN / CREATININE RATIO, URINE
Creatinine, Urine: 56 mg/dL
Protein Creatinine Ratio: 0.14 mg/mg{Cre} (ref 0.00–0.15)
TOTAL PROTEIN, URINE: 8 mg/dL

## 2017-03-31 MED ORDER — LABETALOL HCL 200 MG PO TABS: 200.0000 mg | ORAL_TABLET | Freq: Two times a day (BID) | ORAL | 2 refills | Status: DC

## 2017-03-31 NOTE — Discharge Instructions (Signed)

## 2017-03-31 NOTE — Progress Notes (Signed)
ROB. C/o sides of breast feels funny, no pain, little lump x 2 weeks.  Patient resigned BTS. That is how she writes her name!Marland Kitchen

## 2017-03-31 NOTE — Progress Notes (Signed)
   PRENATAL VISIT NOTE  Subjective:  Laura Gould is a 32 y.o. G1P0 at [redacted]w[redacted]d being seen today for ongoing prenatal care.  She is currently monitored for the following issues for this high-risk pregnancy and has Adenocarcinoma in situ (AIS) of uterine cervix; Marginal insertion of umbilical cord affecting management of mother; Supervision of normal first pregnancy; Essential hypertension; Mild persistent asthma without complication; Chronic hypertension affecting pregnancy; and Edema during pregnancy on their problem list.  Patient reports headache yesterday that resolved with tylenol.  Contractions: Not present. Vag. Bleeding: None.  Movement: Present. Denies leaking of fluid.   The following portions of the patient's history were reviewed and updated as appropriate: allergies, current medications, past family history, past medical history, past social history, past surgical history and problem list. Problem list updated.  Objective:   Vitals:   03/31/17 1357 03/31/17 1358  BP: (!) 160/99 129/85  Pulse: 96 88  Weight: 193 lb 14.4 oz (88 kg)     Fetal Status: Fetal Heart Rate (bpm): 146   Movement: Present     General:  Alert, oriented and cooperative. Patient is in no acute distress.  Skin: Skin is warm and dry. No rash noted.   Cardiovascular: Normal heart rate noted  Respiratory: Normal respiratory effort, no problems with respiration noted  Abdomen: Soft, gravid, appropriate for gestational age.  Pain/Pressure: Absent     Pelvic: Cervical exam deferred        Extremities: Normal range of motion.  Edema: None  Mental Status:  Normal mood and affect. Normal behavior. Normal judgment and thought content.   Assessment and Plan:  Pregnancy: G1P0 at [redacted]w[redacted]d  1. Adenocarcinoma in situ (AIS) of uterine cervix ECC 06/2016 negative  2. Chronic hypertension affecting pregnancy BP elevated today, headache yesterday that resolved Continues with baby aspirin  3. Encounter for  supervision of normal first pregnancy in third trimester BTL papers resigned today  4. Marginal insertion of umbilical cord affecting management of mother Normal on [place] 03/19/17  5. Edema during pregnancy in third trimester Bilateral edema on lateral side of breasts Reviewed wearing tighter fitting bras with improved support  With elevated BP in severe range, will send to MAU for rule out PEC Reviewed s/s PEC   Preterm labor symptoms and general obstetric precautions including but not limited to vaginal bleeding, contractions, leaking of fluid and fetal movement were reviewed in detail with the patient. Please refer to After Visit Summary for other counseling recommendations.  Return in about 2 weeks (around 04/14/2017) for OB visit (MD).   Sloan Leiter, MD

## 2017-03-31 NOTE — MAU Note (Signed)
Urine in lab 

## 2017-03-31 NOTE — MAU Provider Note (Signed)
History     CSN: [id number]  Arrival date and time: 03/31/17 1506  G1 @29 .4 weeks sent from office for elevated BP. Denies HA, visual disturbances, and epigastric pain. Reports good FM. No VB, LOF, or ctx. Her pregnancy is complicated by CHTN-not on meds, obesity, and marginal cords insertion.    OB History    Gravida Para Term Preterm AB Living   1             SAB TAB Ectopic Multiple Live Births                  Past Medical History:  Diagnosis Date  . Hypertension   . Vaginal Pap smear, abnormal     Past Surgical History:  Procedure Laterality Date  . COLPOSCOPY  2016  . TOOTH EXTRACTION      History reviewed. No pertinent family history.  Social History   Tobacco Use  . Smoking status: Never Smoker  . Smokeless tobacco: Never Used  Substance Use Topics  . Alcohol use: No    Frequency: Never  . Drug use: No    Allergies: No Known Allergies  Medications Prior to Admission  Medication Sig Dispense Refill Last Dose  . acetaminophen (TYLENOL) 500 MG tablet Take 500 mg every 6 (six) hours as needed by mouth for moderate pain or headache.   03/30/2017 at Unknown time  . aspirin EC 81 MG tablet Take 1 tablet (81 mg total) by mouth daily. Take after 12 weeks for prevention of preeclampsia later in pregnancy 300 tablet 2 03/31/2017 at Unknown time  . Prenatal Multivit-Min-Fe-FA (PRENATAL VITAMINS) 0.8 MG tablet Take 1 tablet by mouth daily. 30 tablet 12 03/31/2017 at Unknown time  . albuterol (PROVENTIL HFA;VENTOLIN HFA) 108 (90 Base) MCG/ACT inhaler Inhale 1 puff every 6 (six) hours as needed into the lungs for wheezing or shortness of breath.   rescue  . Elastic Bandages & Supports (COTTON SOCKS/MATERNITY) MISC Maternity compression hose 20/30 mmhg 1 each 0     Review of Systems  Eyes: Negative for visual disturbance.  Cardiovascular: Positive for leg swelling.  Gastrointestinal: Negative for abdominal pain.  Genitourinary: Negative for vaginal bleeding and vaginal  discharge.  Neurological: Negative for headaches.   Physical Exam   Blood pressure (!) 143/73, pulse 87, temperature 98.8 F (37.1 C), resp. rate 15, height 4\' 11"  (1.499 m), weight 192 lb (87.1 kg), last menstrual period 09/05/2016, SpO2 99 %. Patient Vitals for the past 24 hrs:  BP Temp Pulse Resp SpO2 Height Weight  03/31/17 1646 (!) 143/73 - 87 - - - -  03/31/17 1616 (!) 142/79 - 87 - - - -  03/31/17 1604 (!) 149/92 98.8 F (37.1 C) 89 15 99 % 4\' 11"  (1.499 m) 192 lb (87.1 kg)  03/31/17 1602 (!) 150/79 - 90 - 99 % - -   Physical Exam  Nursing note and vitals reviewed. Constitutional: She is oriented to person, place, and time. She appears well-developed and well-nourished. No distress.  HENT:  Head: Normocephalic and atraumatic.  Neck: Normal range of motion.  Cardiovascular: Normal rate.  Musculoskeletal: Normal range of motion. She exhibits edema (1+ BLE).  Neurological: She is alert and oriented to person, place, and time.  Skin: Skin is warm and dry.  Psychiatric: She has a normal mood and affect.  EFM: 135 bpm, mod variability, + accels, no decels Toco: none  Results for orders placed or performed during the hospital encounter of 03/31/17 (from the past 24  hour(s))  Protein / creatinine ratio, urine     Status: None   Collection Time: 03/31/17  3:25 PM  Result Value Ref Range   Creatinine, Urine 56.00 mg/dL   Total Protein, Urine 8 mg/dL   Protein Creatinine Ratio 0.14 0.00 - 0.15 mg/mg[Cre]  CBC     Status: Abnormal   Collection Time: 03/31/17  3:55 PM  Result Value Ref Range   WBC 5.7 4.0 - 10.5 K/uL   RBC 3.83 (L) 3.87 - 5.11 MIL/uL   Hemoglobin 11.9 (L) 12.0 - 15.0 g/dL   HCT 35.2 (L) 36.0 - 46.0 %   MCV 91.9 78.0 - 100.0 fL   MCH 31.1 26.0 - 34.0 pg   MCHC 33.8 30.0 - 36.0 g/dL   RDW 13.7 11.5 - 15.5 %   Platelets 365 150 - 400 K/uL  Comprehensive metabolic panel     Status: Abnormal   Collection Time: 03/31/17  3:55 PM  Result Value Ref Range   Sodium  137 135 - 145 mmol/L   Potassium 3.4 (L) 3.5 - 5.1 mmol/L   Chloride 105 101 - 111 mmol/L   CO2 24 22 - 32 mmol/L   Glucose, Bld 82 65 - 99 mg/dL   BUN <5 (L) 6 - 20 mg/dL   Creatinine, Ser 0.50 0.44 - 1.00 mg/dL   Calcium 9.0 8.9 - 10.3 mg/dL   Total Protein 6.6 6.5 - 8.1 g/dL   Albumin 3.2 (L) 3.5 - 5.0 g/dL   AST 22 15 - 41 U/L   ALT 22 14 - 54 U/L   Alkaline Phosphatase 88 38 - 126 U/L   Total Bilirubin 0.4 0.3 - 1.2 mg/dL   GFR calc non Af Amer >60 >60 mL/min   GFR calc Af Amer >60 >60 mL/min   Anion gap 8 5 - 15   MAU Course  Procedures  MDM Labs ordered and reviewed. No evidence of superimposed pre-e. Will start Labetalol and f/u in OB office. Stable for discharge home.  Assessment and Plan   1. [redacted] weeks gestation of pregnancy   2. Chronic hypertension affecting pregnancy   3. NST (non-stress test) reactive   4. Maternal chronic hypertension in third trimester    Discharge home Follow up in OB office next week Rx Labetalol Pre-e precautions  Allergies as of 03/31/2017   No Known Allergies     Medication List    TAKE these medications   acetaminophen 500 MG tablet Commonly known as:  TYLENOL Take 500 mg every 6 (six) hours as needed by mouth for moderate pain or headache.   albuterol 108 (90 Base) MCG/ACT inhaler Commonly known as:  PROVENTIL HFA;VENTOLIN HFA Inhale 1 puff every 6 (six) hours as needed into the lungs for wheezing or shortness of breath.   aspirin EC 81 MG tablet Take 1 tablet (81 mg total) by mouth daily. Take after 12 weeks for prevention of preeclampsia later in pregnancy   Cotton Socks/Maternity Misc Maternity compression hose 20/30 mmhg   labetalol 200 MG tablet Commonly known as:  NORMODYNE Take 1 tablet (200 mg total) by mouth 2 (two) times daily.   Prenatal Vitamins 0.8 MG tablet Take 1 tablet by mouth daily.      Julianne Handler, CNM 03/31/2017, 5:09 PM

## 2017-04-07 ENCOUNTER — Other Ambulatory Visit (HOSPITAL_COMMUNITY): Payer: Self-pay | Admitting: Maternal & Fetal Medicine

## 2017-04-07 ENCOUNTER — Ambulatory Visit (HOSPITAL_COMMUNITY)
Admission: RE | Admit: 2017-04-07 | Discharge: 2017-04-07 | Disposition: A | Discharge status: Home / Self Care | Payer: Medicaid Other | Source: Ambulatory Visit | Attending: Obstetrics | Admitting: Obstetrics

## 2017-04-07 DIAGNOSIS — O99213 Obesity complicating pregnancy, third trimester: Secondary | ICD-10-CM | POA: Diagnosis not present

## 2017-04-07 DIAGNOSIS — O163 Unspecified maternal hypertension, third trimester: Secondary | ICD-10-CM

## 2017-04-07 DIAGNOSIS — Z3A30 30 weeks gestation of pregnancy: Secondary | ICD-10-CM | POA: Diagnosis not present

## 2017-04-07 DIAGNOSIS — Z3689 Encounter for other specified antenatal screening: Secondary | ICD-10-CM

## 2017-04-07 DIAGNOSIS — O10013 Pre-existing essential hypertension complicating pregnancy, third trimester: Secondary | ICD-10-CM | POA: Insufficient documentation

## 2017-04-14 ENCOUNTER — Ambulatory Visit (INDEPENDENT_AMBULATORY_CARE_PROVIDER_SITE_OTHER): Payer: Medicaid Other | Admitting: Obstetrics & Gynecology

## 2017-04-14 VITALS — BP 144/93 | HR 91 | Wt 198.0 lb

## 2017-04-14 DIAGNOSIS — Z3403 Encounter for supervision of normal first pregnancy, third trimester: Secondary | ICD-10-CM

## 2017-04-14 DIAGNOSIS — O10919 Unspecified pre-existing hypertension complicating pregnancy, unspecified trimester: Secondary | ICD-10-CM

## 2017-04-14 DIAGNOSIS — O10913 Unspecified pre-existing hypertension complicating pregnancy, third trimester: Secondary | ICD-10-CM

## 2017-04-14 NOTE — Progress Notes (Signed)
   PRENATAL VISIT NOTE  Subjective:  Laura Gould is a 32 y.o. G1P0 at [redacted]w[redacted]d being seen today for ongoing prenatal care.  She is currently monitored for the following issues for this high-risk pregnancy and has Adenocarcinoma in situ (AIS) of uterine cervix; Marginal insertion of umbilical cord affecting management of mother; Supervision of normal first pregnancy; Essential hypertension; Mild persistent asthma without complication; Chronic hypertension affecting pregnancy; and Edema during pregnancy on their problem list.  Patient reports no complaints.  Contractions: Not present. Vag. Bleeding: None.  Movement: Present. Denies leaking of fluid.   The following portions of the patient's history were reviewed and updated as appropriate: allergies, current medications, past family history, past medical history, past social history, past surgical history and problem list. Problem list updated.  Objective:   Vitals:   04/14/17 1304  BP: (!) 144/93  Pulse: 91  Weight: 198 lb (89.8 kg)    Fetal Status: Fetal Heart Rate (bpm): 144   Movement: Present     General:  Alert, oriented and cooperative. Patient is in no acute distress.  Skin: Skin is warm and dry. No rash noted.   Cardiovascular: Normal heart rate noted  Respiratory: Normal respiratory effort, no problems with respiration noted  Abdomen: Soft, gravid, appropriate for gestational age.  Pain/Pressure: Present     Pelvic: Cervical exam deferred        Extremities: Normal range of motion.  Edema: None  Mental Status: Normal mood and affect. Normal behavior. Normal judgment and thought content.   Assessment and Plan:  Pregnancy: G1P0 at [redacted]w[redacted]d  1. Chronic hypertension affecting pregnancy BP recorded at home 120s/70s, continue present medication  2. Encounter for supervision of normal first pregnancy in third trimester Adequate growth by [place]  Preterm labor symptoms and general obstetric precautions including but not  limited to vaginal bleeding, contractions, leaking of fluid and fetal movement were reviewed in detail with the patient. Please refer to After Visit Summary for other counseling recommendations.  Return in about 5 days (around 04/19/2017) for NST AFI.  Future Appointments  Date Time Provider [place]  05/05/2017  1:15 PM [place] 4 WH-MFCUS MFC-US  06/02/2017  1:45 PM [place] 2 WH-MFCUS MFC-US    Emeterio Reeve, MD

## 2017-04-14 NOTE — Patient Instructions (Signed)

## 2017-04-20 ENCOUNTER — Other Ambulatory Visit: Payer: Medicaid Other

## 2017-04-20 ENCOUNTER — Ambulatory Visit (INDEPENDENT_AMBULATORY_CARE_PROVIDER_SITE_OTHER): Payer: Medicaid Other | Admitting: Obstetrics & Gynecology

## 2017-04-20 VITALS — BP 135/85 | HR 102 | Temp 98.3°F | Wt 200.1 lb

## 2017-04-20 DIAGNOSIS — O10913 Unspecified pre-existing hypertension complicating pregnancy, third trimester: Secondary | ICD-10-CM | POA: Diagnosis not present

## 2017-04-20 DIAGNOSIS — O10919 Unspecified pre-existing hypertension complicating pregnancy, unspecified trimester: Secondary | ICD-10-CM

## 2017-04-20 NOTE — Patient Instructions (Signed)

## 2017-04-20 NOTE — Progress Notes (Signed)
NST reactive    PRENATAL VISIT NOTE  Subjective:  Laura Gould is a 32 y.o. G1P0 at [redacted]w[redacted]d being seen today for ongoing prenatal care.  She is currently monitored for the following issues for this high-risk pregnancy and has Adenocarcinoma in situ (AIS) of uterine cervix; Marginal insertion of umbilical cord affecting management of mother; Supervision of normal first pregnancy; Essential hypertension; Mild persistent asthma without complication; Chronic hypertension affecting pregnancy; and Edema during pregnancy on their problem list.  Patient reports no complaints.  Contractions: Not present. Vag. Bleeding: None.  Movement: Present. Denies leaking of fluid.   The following portions of the patient's history were reviewed and updated as appropriate: allergies, current medications, past family history, past medical history, past social history, past surgical history and problem list. Problem list updated.  Objective:   Vitals:   04/20/17 1343  BP: 135/85  Pulse: (!) 102  Temp: 98.3 F (36.8 C)  Weight: 200 lb 1.6 oz (90.8 kg)    Fetal Status:     Movement: Present     General:  Alert, oriented and cooperative. Patient is in no acute distress.  Skin: Skin is warm and dry. No rash noted.   Cardiovascular: Normal heart rate noted  Respiratory: Normal respiratory effort, no problems with respiration noted  Abdomen: Soft, gravid, appropriate for gestational age.  Pain/Pressure: Present     Pelvic: Cervical exam deferred        Extremities: Normal range of motion.  Edema: Trace  Mental Status: Normal mood and affect. Normal behavior. Normal judgment and thought content.   Assessment and Plan:  Pregnancy: G1P0 at [redacted]w[redacted]d  There are no diagnoses linked to this encounter. Preterm labor symptoms and general obstetric precautions including but not limited to vaginal bleeding, contractions, leaking of fluid and fetal movement were reviewed in detail with the patient. Please refer  to After Visit Summary for other counseling recommendations.  No follow-ups on file.  Future Appointments  Date Time Provider [place]  04/20/2017  2:45 PM [place] None  05/05/2017  1:15 PM WH-MFC [place] 4 WH-MFCUS MFC-US  06/02/2017  1:45 PM [place] 2 WH-MFCUS MFC-US    Emeterio Reeve, MD

## 2017-04-27 ENCOUNTER — Ambulatory Visit (INDEPENDENT_AMBULATORY_CARE_PROVIDER_SITE_OTHER): Payer: Medicaid Other | Admitting: Obstetrics & Gynecology

## 2017-04-27 ENCOUNTER — Other Ambulatory Visit: Payer: Medicaid Other

## 2017-04-27 VITALS — BP 152/88 | HR 89 | Temp 97.8°F | Wt 203.3 lb

## 2017-04-27 DIAGNOSIS — O10919 Unspecified pre-existing hypertension complicating pregnancy, unspecified trimester: Secondary | ICD-10-CM

## 2017-04-27 DIAGNOSIS — Z3403 Encounter for supervision of normal first pregnancy, third trimester: Secondary | ICD-10-CM

## 2017-04-27 DIAGNOSIS — O10913 Unspecified pre-existing hypertension complicating pregnancy, third trimester: Secondary | ICD-10-CM | POA: Diagnosis not present

## 2017-04-27 MED ORDER — COMFORT FIT MATERNITY SUPP LG MISC: 1.0000 [IU] | Freq: Every day | 0 refills | Status: DC

## 2017-04-27 NOTE — Patient Instructions (Signed)

## 2017-04-27 NOTE — Progress Notes (Signed)
maternit  PRENATAL VISIT NOTE  Subjective:  Laura Gould is a 32 y.o. G1P0 at [redacted]w[redacted]d being seen today for ongoing prenatal care.  She is currently monitored for the following issues for this high-risk pregnancy and has Adenocarcinoma in situ (AIS) of uterine cervix; Marginal insertion of umbilical cord affecting management of mother; Supervision of normal first pregnancy; Essential hypertension; Mild persistent asthma without complication; Chronic hypertension affecting pregnancy; and Edema during pregnancy on their problem list.  Patient reports stress following the passing of her father.  Contractions: Not present. Vag. Bleeding: None.  Movement: Present. Denies leaking of fluid.   The following portions of the patient's history were reviewed and updated as appropriate: allergies, current medications, past family history, past medical history, past social history, past surgical history and problem list. Problem list updated.  Objective:   Vitals:   04/27/17 1337  BP: (!) 152/88  Pulse: 89  Temp: 97.8 F (36.6 C)  Weight: 203 lb 4.8 oz (92.2 kg)    Fetal Status: Fetal Heart Rate (bpm): NST   Movement: Present     General:  Alert, oriented and cooperative. Patient is in no acute distress.  Skin: Skin is warm and dry. No rash noted.   Cardiovascular: Normal heart rate noted  Respiratory: Normal respiratory effort, no problems with respiration noted  Abdomen: Soft, gravid, appropriate for gestational age.  Pain/Pressure: Present     Pelvic: Cervical exam deferred        Extremities: Normal range of motion.  Edema: Trace  Mental Status: Normal mood and affect. Normal behavior. Normal judgment and thought content.   Assessment and Plan:  Pregnancy: G1P0 at [redacted]w[redacted]d  There are no diagnoses linked to this encounter. Preterm labor symptoms and general obstetric precautions including but not limited to vaginal bleeding, contractions, leaking of fluid and fetal movement were  reviewed in detail with the patient. Please refer to After Visit Summary for other counseling recommendations.  Return in about 1 week (around 05/04/2017) for NST AFI.  NST reactive today Future Appointments  Date Time Provider [place]  05/05/2017  1:15 PM WH-MFC [place] 4 WH-MFCUS MFC-US  06/02/2017  1:45 PM WH-MFC [place] 2 WH-MFCUS MFC-US    Emeterio Reeve, MD

## 2017-04-28 ENCOUNTER — Other Ambulatory Visit: Payer: Medicaid Other

## 2017-04-28 ENCOUNTER — Encounter: Payer: Medicaid Other | Admitting: Obstetrics and Gynecology

## 2017-05-03 ENCOUNTER — Other Ambulatory Visit: Payer: Medicaid Other

## 2017-05-03 ENCOUNTER — Ambulatory Visit (INDEPENDENT_AMBULATORY_CARE_PROVIDER_SITE_OTHER): Payer: Medicaid Other | Admitting: Obstetrics and Gynecology

## 2017-05-03 ENCOUNTER — Encounter: Payer: Self-pay | Admitting: Obstetrics and Gynecology

## 2017-05-03 VITALS — BP 151/88 | HR 91 | Wt 208.0 lb

## 2017-05-03 DIAGNOSIS — O10913 Unspecified pre-existing hypertension complicating pregnancy, third trimester: Secondary | ICD-10-CM

## 2017-05-03 DIAGNOSIS — O10919 Unspecified pre-existing hypertension complicating pregnancy, unspecified trimester: Secondary | ICD-10-CM

## 2017-05-03 DIAGNOSIS — Z3403 Encounter for supervision of normal first pregnancy, third trimester: Secondary | ICD-10-CM

## 2017-05-03 MED ORDER — ASPIRIN EC 81 MG PO TBEC: 81.0000 mg | DELAYED_RELEASE_TABLET | Freq: Every day | ORAL | 2 refills | Status: DC

## 2017-05-03 MED ORDER — LABETALOL HCL 200 MG PO TABS: 400.0000 mg | ORAL_TABLET | Freq: Two times a day (BID) | ORAL | 3 refills | Status: DC

## 2017-05-03 NOTE — Progress Notes (Signed)
   PRENATAL VISIT NOTE  Subjective:  Laura Gould is a 32 y.o. G1P0 at [redacted]w[redacted]d being seen today for ongoing prenatal care.  She is currently monitored for the following issues for this high-risk pregnancy and has Adenocarcinoma in situ (AIS) of uterine cervix; Marginal insertion of umbilical cord affecting management of mother; Supervision of normal first pregnancy; Essential hypertension; Mild persistent asthma without complication; Chronic hypertension affecting pregnancy; and Edema during pregnancy on their problem list.  Patient reports no complaints.  Contractions: Irritability. Vag. Bleeding: None.  Movement: Present. Denies leaking of fluid.   The following portions of the patient's history were reviewed and updated as appropriate: allergies, current medications, past family history, past medical history, past social history, past surgical history and problem list. Problem list updated.  Objective:   Vitals:   05/03/17 1311  BP: (!) 151/88  Pulse: 91  Weight: 208 lb (94.3 kg)    Fetal Status: Fetal Heart Rate (bpm): NST   Movement: Present     General:  Alert, oriented and cooperative. Patient is in no acute distress.  Skin: Skin is warm and dry. No rash noted.   Cardiovascular: Normal heart rate noted  Respiratory: Normal respiratory effort, no problems with respiration noted  Abdomen: Soft, gravid, appropriate for gestational age.  Pain/Pressure: Present     Pelvic: Cervical exam deferred        Extremities: Normal range of motion.  Edema: Trace  Mental Status: Normal mood and affect. Normal behavior. Normal judgment and thought content.   Assessment and Plan:  Pregnancy: G1P0 at [redacted]w[redacted]d  1. Chronic hypertension affecting pregnancy Patient with elevated BP on last 2 visit. Will increase labetalol to 400 mg BID Follow up growth ultrasound scheduled NST reviewed and reactive with baseline 125, mod variability, +accels, no decels - Fetal nonstress test - US OB  Limited  2. Encounter for supervision of normal first pregnancy in third trimester Patient is doing well without complaints Discussed plan for IOL at 39 weeks  Preterm labor symptoms and general obstetric precautions including but not limited to vaginal bleeding, contractions, leaking of fluid and fetal movement were reviewed in detail with the patient. Please refer to After Visit Summary for other counseling recommendations.  No follow-ups on file.  Future Appointments  Date Time Provider [place]  05/05/2017  1:15 PM [facility] [place] 4 WH-MFCUS MFC-US  06/02/2017  1:45 PM [place] 2 WH-MFCUS MFC-US    Mora Bellman, MD

## 2017-05-03 NOTE — Progress Notes (Signed)
NST DX Chronic Hypertension  Pt complains of having lower abdominal cramping that started yesterday.

## 2017-05-05 ENCOUNTER — Ambulatory Visit (HOSPITAL_COMMUNITY): Payer: Medicaid Other

## 2017-05-06 ENCOUNTER — Encounter (HOSPITAL_COMMUNITY): Payer: Self-pay

## 2017-05-06 ENCOUNTER — Inpatient Hospital Stay (HOSPITAL_COMMUNITY)
Admission: AD | Admit: 2017-05-06 | Discharge: 2017-05-06 | Disposition: A | Discharge status: Home / Self Care | Payer: Medicaid Other | Source: Ambulatory Visit | Attending: Obstetrics & Gynecology | Admitting: Obstetrics & Gynecology

## 2017-05-06 ENCOUNTER — Other Ambulatory Visit: Payer: Medicaid Other

## 2017-05-06 ENCOUNTER — Ambulatory Visit (INDEPENDENT_AMBULATORY_CARE_PROVIDER_SITE_OTHER): Payer: Medicaid Other

## 2017-05-06 VITALS — BP 172/93

## 2017-05-06 DIAGNOSIS — O10913 Unspecified pre-existing hypertension complicating pregnancy, third trimester: Secondary | ICD-10-CM | POA: Diagnosis not present

## 2017-05-06 DIAGNOSIS — Z79899 Other long term (current) drug therapy: Secondary | ICD-10-CM | POA: Diagnosis not present

## 2017-05-06 DIAGNOSIS — O99283 Endocrine, nutritional and metabolic diseases complicating pregnancy, third trimester: Secondary | ICD-10-CM | POA: Diagnosis not present

## 2017-05-06 DIAGNOSIS — O163 Unspecified maternal hypertension, third trimester: Secondary | ICD-10-CM | POA: Diagnosis present

## 2017-05-06 DIAGNOSIS — Z3A34 34 weeks gestation of pregnancy: Secondary | ICD-10-CM | POA: Insufficient documentation

## 2017-05-06 DIAGNOSIS — Z7982 Long term (current) use of aspirin: Secondary | ICD-10-CM | POA: Diagnosis not present

## 2017-05-06 DIAGNOSIS — O10919 Unspecified pre-existing hypertension complicating pregnancy, unspecified trimester: Secondary | ICD-10-CM

## 2017-05-06 DIAGNOSIS — R0602 Shortness of breath: Secondary | ICD-10-CM | POA: Insufficient documentation

## 2017-05-06 DIAGNOSIS — E876 Hypokalemia: Secondary | ICD-10-CM | POA: Diagnosis not present

## 2017-05-06 LAB — URINALYSIS, ROUTINE W REFLEX MICROSCOPIC
Bilirubin Urine: NEGATIVE
Glucose, UA: NEGATIVE mg/dL
HGB URINE DIPSTICK: NEGATIVE
Ketones, ur: 5 mg/dL — AB
Leukocytes, UA: NEGATIVE
NITRITE: NEGATIVE
Protein, ur: NEGATIVE mg/dL
Specific Gravity, Urine: 1.004 — ABNORMAL LOW (ref 1.005–1.030)
pH: 7 (ref 5.0–8.0)

## 2017-05-06 LAB — COMPREHENSIVE METABOLIC PANEL
ALBUMIN: 2.7 g/dL — AB (ref 3.5–5.0)
ALK PHOS: 130 U/L — AB (ref 38–126)
ALT: 44 U/L (ref 14–54)
AST: 36 U/L (ref 15–41)
Anion gap: 11 (ref 5–15)
BILIRUBIN TOTAL: 0.5 mg/dL (ref 0.3–1.2)
CO2: 23 mmol/L (ref 22–32)
Calcium: 9.3 mg/dL (ref 8.9–10.3)
Chloride: 107 mmol/L (ref 101–111)
Creatinine, Ser: 0.58 mg/dL (ref 0.44–1.00)
GFR calc Af Amer: >60 mL/min (ref >60)
GFR calc non Af Amer: >60 mL/min (ref >60)
GLUCOSE: 88 mg/dL (ref 65–99)
POTASSIUM: 2.7 mmol/L — AB (ref 3.5–5.1)
Sodium: 141 mmol/L (ref 135–145)
TOTAL PROTEIN: 6.4 g/dL — AB (ref 6.5–8.1)

## 2017-05-06 LAB — CBC
HEMATOCRIT: 34.7 % — AB (ref 36.0–46.0)
Hemoglobin: 11.7 g/dL — ABNORMAL LOW (ref 12.0–15.0)
MCH: 31.1 pg (ref 26.0–34.0)
MCHC: 33.7 g/dL (ref 30.0–36.0)
MCV: 92.3 fL (ref 78.0–100.0)
Platelets: 314 10*3/uL (ref 150–400)
RBC: 3.76 MIL/uL — AB (ref 3.87–5.11)
RDW: 14.5 % (ref 11.5–15.5)
WBC: 6 10*3/uL (ref 4.0–10.5)

## 2017-05-06 LAB — PROTEIN / CREATININE RATIO, URINE
Creatinine, Urine: 51 mg/dL
Protein Creatinine Ratio: 0.2 mg/mg{Cre} — ABNORMAL HIGH (ref 0.00–0.15)
Total Protein, Urine: 10 mg/dL

## 2017-05-06 MED ORDER — LABETALOL HCL 200 MG PO TABS: 400.0000 mg | ORAL_TABLET | Freq: Three times a day (TID) | ORAL | 3 refills | Status: DC

## 2017-05-06 MED ORDER — LABETALOL HCL 5 MG/ML IV SOLN: 20.0000 mg | INTRAVENOUS | Status: DC | PRN

## 2017-05-06 MED ORDER — HYDRALAZINE HCL 20 MG/ML IJ SOLN
5.0000 mg | INTRAMUSCULAR | Status: AC | PRN
  Administered 2017-05-06: 10 mg via INTRAVENOUS
  Administered 2017-05-06: 5 mg via INTRAVENOUS
  Filled 2017-05-06 (×2): qty 1

## 2017-05-06 MED ORDER — POTASSIUM CHLORIDE CRYS ER 20 MEQ PO TBCR: 20.0000 meq | EXTENDED_RELEASE_TABLET | Freq: Two times a day (BID) | ORAL | 0 refills | Status: DC

## 2017-05-06 MED ORDER — POTASSIUM CHLORIDE 10 MEQ/100ML IV SOLN
10.0000 meq | Freq: Once | INTRAVENOUS | Status: AC
  Administered 2017-05-06: 10 meq via INTRAVENOUS
  Filled 2017-05-06: qty 100

## 2017-05-06 MED ORDER — LACTATED RINGERS IV SOLN
INTRAVENOUS | Status: DC
  Administered 2017-05-06: 19:00:00 via INTRAVENOUS

## 2017-05-06 NOTE — Progress Notes (Signed)
Pt Presents for NST today  B/P:162/94  P: 87  Repeat B/P after NST: 172/93  P: 91  Consulted w/ Provider in office NST Reactive Pt needs to go to MAU for further monitoring and PIH testing  Pt made aware and advised to go to MAU ASAP. Pt stated she had other obligations etc to handle pt urged to go asap / right now to due elevated BP and swelling in legs and feet.  Pt states she is going.

## 2017-05-06 NOTE — MAU Note (Signed)
elevated pressure in office. Denies any H/A or blurred vision. Had swelling in her hands and feet.

## 2017-05-06 NOTE — MAU Provider Note (Signed)
History     CSN: [id number]  Arrival date and time: 05/06/17 6962   First Provider Initiated Contact with Patient 05/06/17 1806     Chief Complaint  Patient presents with  . Hypertension   HPI Laura Gould is a 32 y.o. G1P0 at [redacted]w[redacted]d who presents from the office for evaluation of elevated blood pressure. It was severe range in the office today. She states she took her labetalol today and is currently taking 400mg  BID. She denies any headache, visual changes or epigastric pain. She denies any pain. Reports good fetal movement.   OB History    Gravida  1   Para      Term      Preterm      AB      Living        SAB      TAB      Ectopic      Multiple      Live Births              Past Medical History:  Diagnosis Date  . Hypertension   . Vaginal Pap smear, abnormal     Past Surgical History:  Procedure Laterality Date  . COLPOSCOPY  2016  . TOOTH EXTRACTION      History reviewed. No pertinent family history.  Social History   Tobacco Use  . Smoking status: Never Smoker  . Smokeless tobacco: Never Used  Substance Use Topics  . Alcohol use: No    Frequency: Never  . Drug use: No    Allergies: No Known Allergies  Medications Prior to Admission  Medication Sig Dispense Refill Last Dose  . acetaminophen (TYLENOL) 500 MG tablet Take 500 mg every 6 (six) hours as needed by mouth for moderate pain or headache.   Taking  . albuterol (PROVENTIL HFA;VENTOLIN HFA) 108 (90 Base) MCG/ACT inhaler Inhale 1 puff every 6 (six) hours as needed into the lungs for wheezing or shortness of breath.   Taking  . aspirin EC 81 MG tablet Take 1 tablet (81 mg total) by mouth daily. Take after 12 weeks for prevention of preeclampsia later in pregnancy 300 tablet 2 Taking  . aspirin EC 81 MG tablet Take 1 tablet (81 mg total) by mouth daily. Take after 12 weeks for prevention of preeclampsia later in pregnancy 300 tablet 2   . Elastic Bandages & Supports  (COMFORT FIT MATERNITY SUPP LG) MISC 1 Units by Does not apply route daily. 1 each 0 Taking  . Elastic Bandages & Supports (COTTON SOCKS/MATERNITY) MISC Maternity compression hose 20/30 mmhg 1 each 0 Taking  . labetalol (NORMODYNE) 200 MG tablet Take 1 tablet (200 mg total) by mouth 2 (two) times daily. 60 tablet 2 Taking  . labetalol (NORMODYNE) 200 MG tablet Take 2 tablets (400 mg total) by mouth 2 (two) times daily. 120 tablet 3   . Prenatal Multivit-Min-Fe-FA (PRENATAL VITAMINS) 0.8 MG tablet Take 1 tablet by mouth daily. 30 tablet 12 Taking    Review of Systems  Constitutional: Negative.  Negative for fatigue and fever.  HENT: Negative.   Eyes: Negative for visual disturbance.  Respiratory: Negative.  Negative for shortness of breath.   Cardiovascular: Negative.  Negative for chest pain.  Gastrointestinal: Negative.  Negative for abdominal pain, constipation, diarrhea, nausea and vomiting.  Genitourinary: Negative.  Negative for dysuria.  Neurological: Negative.  Negative for dizziness and headaches.   Physical Exam   Blood pressure (!) 169/94, pulse 74, temperature 98.7  F (37.1 C), resp. rate 18, height 4\' 10"  (1.473 m), weight 206 lb (93.4 kg), last menstrual period 09/05/2016.  Patient Vitals for the past 24 hrs:  BP Temp Pulse Resp Height Weight  05/06/17 1916 (!) 148/86 - 75 - - -  05/06/17 1901 (!) 142/81 - 82 - - -  05/06/17 1846 (!) 154/95 - 86 - - -  05/06/17 1837 (!) 150/83 - 86 - - -  05/06/17 1831 (!) 163/81 - 91 - - -  05/06/17 1816 (!) 168/91 - 81 - - -  05/06/17 1801 (!) 169/94 - 74 - - -  05/06/17 1754 (!) 168/89 - 82 - - -  05/06/17 1736 (!) 166/89 98.7 F (37.1 C) 84 18 4\' 10"  (1.473 m) 206 lb (93.4 kg)    Physical Exam  Nursing note and vitals reviewed. Constitutional: She is oriented to person, place, and time. She appears well-developed and well-nourished. No distress.  HENT:  Head: Normocephalic.  Eyes: Pupils are equal, round, and reactive to  light.  Cardiovascular: Normal rate, regular rhythm and normal heart sounds.  Respiratory: Effort normal and breath sounds normal. No respiratory distress.  GI: Soft. Bowel sounds are normal. She exhibits no distension. There is no tenderness.  Neurological: She is alert and oriented to person, place, and time.  Skin: Skin is warm and dry.  Psychiatric: She has a normal mood and affect. Her behavior is normal. Judgment and thought content normal.   Fetal Tracing:  Baseline: 130 Variability: moderate Accels: 15x15 Decels: none  Toco: occasional uc's   MAU Course  Procedures Results for orders placed or performed during the hospital encounter of 05/06/17 (from the past 24 hour(s))  Protein / creatinine ratio, urine     Status: Abnormal   Collection Time: 05/06/17  4:45 PM  Result Value Ref Range   Creatinine, Urine 51.00 mg/dL   Total Protein, Urine 10 mg/dL   Protein Creatinine Ratio 0.20 (H) 0.00 - 0.15 mg/mg[Cre]  Urinalysis, Routine w reflex microscopic     Status: Abnormal   Collection Time: 05/06/17  4:45 PM  Result Value Ref Range   Color, Urine YELLOW YELLOW   APPearance HAZY (A) CLEAR   Specific Gravity, Urine 1.004 (L) 1.005 - 1.030   pH 7.0 5.0 - 8.0   Glucose, UA NEGATIVE NEGATIVE mg/dL   Hgb urine dipstick NEGATIVE NEGATIVE   Bilirubin Urine NEGATIVE NEGATIVE   Ketones, ur 5 (A) NEGATIVE mg/dL   Protein, ur NEGATIVE NEGATIVE mg/dL   Nitrite NEGATIVE NEGATIVE   Leukocytes, UA NEGATIVE NEGATIVE  CBC     Status: Abnormal   Collection Time: 05/06/17  5:24 PM  Result Value Ref Range   WBC 6.0 4.0 - 10.5 K/uL   RBC 3.76 (L) 3.87 - 5.11 MIL/uL   Hemoglobin 11.7 (L) 12.0 - 15.0 g/dL   HCT 34.7 (L) 36.0 - 46.0 %   MCV 92.3 78.0 - 100.0 fL   MCH 31.1 26.0 - 34.0 pg   MCHC 33.7 30.0 - 36.0 g/dL   RDW 14.5 11.5 - 15.5 %   Platelets 314 150 - 400 K/uL  Comprehensive metabolic panel     Status: Abnormal   Collection Time: 05/06/17  5:24 PM  Result Value Ref Range    Sodium 141 135 - 145 mmol/L   Potassium 2.7 (LL) 3.5 - 5.1 mmol/L   Chloride 107 101 - 111 mmol/L   CO2 23 22 - 32 mmol/L   Glucose, Bld 88 65 - 99  mg/dL   BUN <5 (L) 6 - 20 mg/dL   Creatinine, Ser 0.58 0.44 - 1.00 mg/dL   Calcium 9.3 8.9 - 10.3 mg/dL   Total Protein 6.4 (L) 6.5 - 8.1 g/dL   Albumin 2.7 (L) 3.5 - 5.0 g/dL   AST 36 15 - 41 U/L   ALT 44 14 - 54 U/L   Alkaline Phosphatase 130 (H) 38 - 126 U/L   Total Bilirubin 0.5 0.3 - 1.2 mg/dL   GFR calc non Af Amer >60 >60 mL/min   GFR calc Af Amer >60 >60 mL/min   Anion gap 11 5 - 15   MDM UA CBC, CMP, protein creat ratio Preeclampsia protocol ordered- 5mg  hydralazine given and BPs returned out of severe range Potassium IV   Consulted with Dr. Elly Modena- will increase labetalol to 400mg  TID and give 2 days of PO potassium repletion at home.  Assessment and Plan   1. [redacted] weeks gestation of pregnancy   2. Chronic hypertension affecting pregnancy   3. Low serum potassium    -Discharge home in stable condition -Rx for potassium sent to patient's pharmacy. Labetalol increased to 400mg  TID -Preeclampsia precautions discussed -Patient advised to follow-up with [facility] as scheduled on Monday for prenatal care -Patient may return to MAU as needed or if her condition were to change or worsen  Wende Mott CNM 05/06/2017, 6:06 PM

## 2017-05-06 NOTE — Discharge Instructions (Signed)
Hypertension During Pregnancy °Hypertension, commonly called high blood pressure, is when the force of blood pumping through your arteries is too strong. Arteries are blood vessels that carry blood from the heart throughout the body. Hypertension during pregnancy can cause problems for you and your baby. Your baby may be born early (prematurely) or may not weigh as much as he or she should at birth. Very bad cases of hypertension during pregnancy can be life-threatening. °Different types of hypertension can occur during pregnancy. These include: °· Chronic hypertension. This happens when: °? You have hypertension before pregnancy and it continues during pregnancy. °? You develop hypertension before you are [redacted] weeks pregnant, and it continues during pregnancy. °· Gestational hypertension. This is hypertension that develops after the 20th week of pregnancy. °· Preeclampsia, also called toxemia of pregnancy. This is a very serious type of hypertension that develops only during pregnancy. It affects the whole body, and it can be very dangerous for you and your baby. ° °Gestational hypertension and preeclampsia usually go away within 6 weeks after your baby is born. Women who have hypertension during pregnancy have a greater chance of developing hypertension later in life or during future pregnancies. °What are the causes? °The exact cause of hypertension is not known. °What increases the risk? °There are certain factors that make it more likely for you to develop hypertension during pregnancy. These include: °· Having hypertension during a previous pregnancy or prior to pregnancy. °· Being overweight. °· Being older than age 40. °· Being pregnant for the first time or being pregnant with more than one baby. °· Becoming pregnant using fertilization methods such as IVF (in vitro fertilization). °· Having diabetes, kidney problems, or systemic lupus erythematosus. °· Having a family history of hypertension. ° °What are the  signs or symptoms? °Chronic hypertension and gestational hypertension rarely cause symptoms. Preeclampsia causes symptoms, which may include: °· Increased protein in your urine. Your health care provider will check for this at every visit before you give birth (prenatal visit). °· Severe headaches. °· Sudden weight gain. °· Swelling of the hands, face, legs, and feet. °· Nausea and vomiting. °· Vision problems, such as blurred or double vision. °· Numbness in the face, arms, legs, and feet. °· Dizziness. °· Slurred speech. °· Sensitivity to bright lights. °· Abdominal pain. °· Convulsions. ° °How is this diagnosed? °You may be diagnosed with hypertension during a routine prenatal exam. At each prenatal visit, you may: °· Have a urine test to check for high amounts of protein in your urine. °· Have your blood pressure checked. A blood pressure reading is recorded as two numbers, such as "120 over 80" (or 120/80). The first ("top") number is called the systolic pressure. It is a measure of the pressure in your arteries when your heart beats. The second ("bottom") number is called the diastolic pressure. It is a measure of the pressure in your arteries as your heart relaxes between beats. Blood pressure is measured in a unit called mm Hg. A normal blood pressure reading is: °? Systolic: below 120. °? Diastolic: below 80. ° °The type of hypertension that you are diagnosed with depends on your test results and when your symptoms developed. °· Chronic hypertension is usually diagnosed before 20 weeks of pregnancy. °· Gestational hypertension is usually diagnosed after 20 weeks of pregnancy. °· Hypertension with high amounts of protein in the urine is diagnosed as preeclampsia. °· Blood pressure measurements that stay above 160 systolic, or above 110 diastolic, are   signs of severe preeclampsia.  How is this treated? Treatment for hypertension during pregnancy varies depending on the type of hypertension you have and how  serious it is.  If you take medicines called ACE inhibitors to treat chronic hypertension, you may need to switch medicines. ACE inhibitors should not be taken during pregnancy.  If you have gestational hypertension, you may need to take blood pressure medicine.  If you are at risk for preeclampsia, your health care provider may recommend that you take a low-dose aspirin every day to prevent high blood pressure during your pregnancy.  If you have severe preeclampsia, you may need to be hospitalized so you and your baby can be monitored closely. You may also need to take medicine (magnesium sulfate) to prevent seizures and to lower blood pressure. This medicine may be given as an injection or through an IV tube.  In some cases, if your condition gets worse, you may need to deliver your baby early.  Follow these instructions at home: Eating and drinking  Drink enough fluid to keep your urine clear or pale yellow.  Eat a healthy diet that is low in salt (sodium). Do not add salt to your food. Check food labels to see how much sodium a food or beverage contains. Lifestyle  Do not use any products that contain nicotine or tobacco, such as cigarettes and e-cigarettes. If you need help quitting, ask your health care provider.  Do not use alcohol.  Avoid caffeine.  Avoid stress as much as possible. Rest and get plenty of sleep. General instructions  Take over-the-counter and prescription medicines only as told by your health care provider.  While lying down, lie on your left side. This keeps pressure off your baby.  While sitting or lying down, raise (elevate) your feet. Try putting some pillows under your lower legs.  Exercise regularly. Ask your health care provider what kinds of exercise are best for you.  Keep all prenatal and follow-up visits as told by your health care provider. This is important. Contact a health care provider if:  You have symptoms that your health care  provider told you may require more treatment or monitoring, such as: ? Fever. ? Vomiting. ? Headache. Get help right away if:  You have severe abdominal pain or vomiting that does not get better with treatment.  You suddenly develop swelling in your hands, ankles, or face.  You gain 4 lbs (1.8 kg) or more in 1 week.  You develop vaginal bleeding, or you have blood in your urine.  You do not feel your baby moving as much as usual.  You have blurred or double vision.  You have muscle twitching or sudden tightening (spasms).  You have shortness of breath.  Your lips or fingernails turn blue. This information is not intended to replace advice given to you by your health care provider. Make sure you discuss any questions you have with your health care provider. Document Released: 09/16/2010 Document Revised: 07/19/2015 Document Reviewed: 06/14/2015 Elsevier Interactive Patient Education  2018 [employer].   Hypokalemia Hypokalemia means that the amount of potassium in the blood is lower than normal.Potassium is a chemical that helps regulate the amount of fluid in the body (electrolyte). It also stimulates muscle tightening (contraction) and helps nerves work properly.Normally, most of the bodys potassium is inside of cells, and only a very small amount is in the blood. Because the amount in the blood is so small, minor changes to potassium levels in the  blood can be life-threatening. What are the causes? This condition may be caused by:  Antibiotic medicine.  Diarrhea or vomiting. Taking too much of a medicine that helps you have a bowel movement (laxative) can cause diarrhea and lead to hypokalemia.  Chronic kidney disease (CKD).  Medicines that help the body get rid of excess fluid (diuretics).  Eating disorders, such as bulimia.  Low magnesium levels in the body.  Sweating a lot.  What are the signs or symptoms? Symptoms of this condition  include:  Weakness.  Constipation.  Fatigue.  Muscle cramps.  Mental confusion.  Skipped heartbeats or irregular heartbeat (palpitations).  Tingling or numbness.  How is this diagnosed? This condition is diagnosed with a blood test. How is this treated? Hypokalemia can be treated by taking potassium supplements by mouth or adjusting the medicines that you take. Treatment may also include eating more foods that contain a lot of potassium. If your potassium level is very low, you may need to get potassium through an IV tube in one of your veins and be monitored in the hospital. Follow these instructions at home:  Take over-the-counter and prescription medicines only as told by your health care provider. This includes vitamins and supplements.  Eat a healthy diet. A healthy diet includes fresh fruits and vegetables, whole grains, healthy fats, and lean proteins.  If instructed, eat more foods that contain a lot of potassium, such as: ? Nuts, such as peanuts and pistachios. ? Seeds, such as sunflower seeds and pumpkin seeds. ? Peas, lentils, and lima beans. ? Whole grain and bran cereals and breads. ? Fresh fruits and vegetables, such as apricots, avocado, bananas, cantaloupe, kiwi, oranges, tomatoes, asparagus, and potatoes. ? Orange juice. ? Tomato juice. ? Red meats. ? Yogurt.  Keep all follow-up visits as told by your health care provider. This is important. Contact a health care provider if:  You have weakness that gets worse.  You feel your heart pounding or racing.  You vomit.  You have diarrhea.  You have diabetes (diabetes mellitus) and you have trouble keeping your blood sugar (glucose) in your target range. Get help right away if:  You have chest pain.  You have shortness of breath.  You have vomiting or diarrhea that lasts for more than 2 days.  You faint. This information is not intended to replace advice given to you by your health care provider.  Make sure you discuss any questions you have with your health care provider. Document Released: 12/29/2004 Document Revised: 08/17/2015 Document Reviewed: 08/17/2015 Elsevier Interactive Patient Education  2018 [employer].

## 2017-05-06 NOTE — MAU Note (Signed)
Urine in lab 

## 2017-05-07 ENCOUNTER — Other Ambulatory Visit: Payer: Self-pay | Admitting: Certified Nurse Midwife

## 2017-05-07 NOTE — Progress Notes (Signed)
I have reviewed the chart and agree with nursing staff's documentation of this patient's encounter.  Educated staff on severe range blood pressures and need for patient to be evaluated at MAU.   Morene Crocker, CNM 05/07/2017 8:02 AM

## 2017-05-10 ENCOUNTER — Other Ambulatory Visit: Payer: Self-pay

## 2017-05-10 ENCOUNTER — Ambulatory Visit (INDEPENDENT_AMBULATORY_CARE_PROVIDER_SITE_OTHER): Payer: Medicaid Other | Admitting: Obstetrics and Gynecology

## 2017-05-10 ENCOUNTER — Encounter: Payer: Self-pay | Admitting: Obstetrics and Gynecology

## 2017-05-10 ENCOUNTER — Other Ambulatory Visit: Payer: Medicaid Other

## 2017-05-10 ENCOUNTER — Inpatient Hospital Stay (HOSPITAL_COMMUNITY)
Admission: AD | Admit: 2017-05-10 | Discharge: 2017-05-11 | DRG: 833 | Disposition: A | Discharge status: Home / Self Care | Payer: Medicaid Other | Source: Ambulatory Visit | Attending: Obstetrics and Gynecology | Admitting: Obstetrics and Gynecology

## 2017-05-10 ENCOUNTER — Encounter (HOSPITAL_COMMUNITY): Payer: Self-pay

## 2017-05-10 VITALS — BP 178/111 | HR 77 | Wt 207.8 lb

## 2017-05-10 DIAGNOSIS — R030 Elevated blood-pressure reading, without diagnosis of hypertension: Secondary | ICD-10-CM | POA: Diagnosis present

## 2017-05-10 DIAGNOSIS — Z3403 Encounter for supervision of normal first pregnancy, third trimester: Secondary | ICD-10-CM

## 2017-05-10 DIAGNOSIS — O10919 Unspecified pre-existing hypertension complicating pregnancy, unspecified trimester: Secondary | ICD-10-CM | POA: Diagnosis present

## 2017-05-10 DIAGNOSIS — Z3A35 35 weeks gestation of pregnancy: Secondary | ICD-10-CM | POA: Diagnosis not present

## 2017-05-10 DIAGNOSIS — O43199 Other malformation of placenta, unspecified trimester: Secondary | ICD-10-CM | POA: Diagnosis present

## 2017-05-10 DIAGNOSIS — O0990 Supervision of high risk pregnancy, unspecified, unspecified trimester: Secondary | ICD-10-CM

## 2017-05-10 DIAGNOSIS — J4530 Mild persistent asthma, uncomplicated: Secondary | ICD-10-CM | POA: Diagnosis present

## 2017-05-10 DIAGNOSIS — O10913 Unspecified pre-existing hypertension complicating pregnancy, third trimester: Secondary | ICD-10-CM | POA: Diagnosis not present

## 2017-05-10 DIAGNOSIS — O1200 Gestational edema, unspecified trimester: Secondary | ICD-10-CM | POA: Diagnosis present

## 2017-05-10 DIAGNOSIS — O10013 Pre-existing essential hypertension complicating pregnancy, third trimester: Secondary | ICD-10-CM | POA: Diagnosis present

## 2017-05-10 DIAGNOSIS — I10 Essential (primary) hypertension: Secondary | ICD-10-CM | POA: Diagnosis present

## 2017-05-10 LAB — TYPE AND SCREEN
ABO/RH(D): B POS
Antibody Screen: NEGATIVE

## 2017-05-10 LAB — COMPREHENSIVE METABOLIC PANEL
ALK PHOS: 142 U/L — AB (ref 38–126)
ALT: 46 U/L (ref 14–54)
AST: 44 U/L — ABNORMAL HIGH (ref 15–41)
Albumin: 2.7 g/dL — ABNORMAL LOW (ref 3.5–5.0)
Anion gap: 11 (ref 5–15)
BILIRUBIN TOTAL: 0.9 mg/dL (ref 0.3–1.2)
BUN: <5 mg/dL — ABNORMAL LOW (ref 6–20)
CALCIUM: 8.7 mg/dL — AB (ref 8.9–10.3)
CHLORIDE: 106 mmol/L (ref 101–111)
CO2: 22 mmol/L (ref 22–32)
CREATININE: 0.62 mg/dL (ref 0.44–1.00)
Glucose, Bld: 68 mg/dL (ref 65–99)
Potassium: 3.1 mmol/L — ABNORMAL LOW (ref 3.5–5.1)
Sodium: 139 mmol/L (ref 135–145)
TOTAL PROTEIN: 6.4 g/dL — AB (ref 6.5–8.1)

## 2017-05-10 LAB — ABO/RH: ABO/RH(D): B POS

## 2017-05-10 LAB — CBC
HEMATOCRIT: 34.8 % — AB (ref 36.0–46.0)
Hemoglobin: 11.7 g/dL — ABNORMAL LOW (ref 12.0–15.0)
MCH: 31.1 pg (ref 26.0–34.0)
MCHC: 33.6 g/dL (ref 30.0–36.0)
MCV: 92.6 fL (ref 78.0–100.0)
PLATELETS: 325 10*3/uL (ref 150–400)
RBC: 3.76 MIL/uL — AB (ref 3.87–5.11)
RDW: 14.5 % (ref 11.5–15.5)
WBC: 5.4 10*3/uL (ref 4.0–10.5)

## 2017-05-10 LAB — PROTEIN / CREATININE RATIO, URINE
CREATININE, URINE: 49 mg/dL
Protein Creatinine Ratio: 0.2 mg/mg{Cre} — ABNORMAL HIGH (ref 0.00–0.15)
TOTAL PROTEIN, URINE: 10 mg/dL

## 2017-05-10 MED ORDER — SODIUM CHLORIDE 0.9 % IV SOLN: 250.0000 mL | INTRAVENOUS | Status: DC | PRN

## 2017-05-10 MED ORDER — ASPIRIN EC 81 MG PO TBEC
81.0000 mg | DELAYED_RELEASE_TABLET | Freq: Every day | ORAL | Status: DC
  Administered 2017-05-10 – 2017-05-11 (×2): 81 mg via ORAL
  Filled 2017-05-10 (×3): qty 1

## 2017-05-10 MED ORDER — SODIUM CHLORIDE 0.9% FLUSH: 3.0000 mL | INTRAVENOUS | Status: DC | PRN

## 2017-05-10 MED ORDER — BETAMETHASONE SOD PHOS & ACET 6 (3-3) MG/ML IJ SUSP
12.0000 mg | Freq: Once | INTRAMUSCULAR | Status: DC
  Filled 2017-05-10: qty 2

## 2017-05-10 MED ORDER — LABETALOL HCL 100 MG PO TABS: 200.0000 mg | ORAL_TABLET | Freq: Once | ORAL | Status: DC

## 2017-05-10 MED ORDER — POTASSIUM CHLORIDE CRYS ER 20 MEQ PO TBCR
40.0000 meq | EXTENDED_RELEASE_TABLET | Freq: Two times a day (BID) | ORAL | Status: DC
  Administered 2017-05-10 – 2017-05-11 (×2): 40 meq via ORAL
  Filled 2017-05-10 (×4): qty 2

## 2017-05-10 MED ORDER — BETAMETHASONE SOD PHOS & ACET 6 (3-3) MG/ML IJ SUSP
12.0000 mg | INTRAMUSCULAR | Status: AC
  Administered 2017-05-10 – 2017-05-11 (×2): 12 mg via INTRAMUSCULAR
  Filled 2017-05-10 (×2): qty 2

## 2017-05-10 MED ORDER — CALCIUM CARBONATE ANTACID 500 MG PO CHEW: 2.0000 | CHEWABLE_TABLET | ORAL | Status: DC | PRN

## 2017-05-10 MED ORDER — LABETALOL HCL 100 MG PO TABS
200.0000 mg | ORAL_TABLET | Freq: Once | ORAL | Status: AC
  Administered 2017-05-10: 200 mg via ORAL
  Filled 2017-05-10: qty 2

## 2017-05-10 MED ORDER — ZOLPIDEM TARTRATE 5 MG PO TABS: 5.0000 mg | ORAL_TABLET | Freq: Every evening | ORAL | Status: DC | PRN

## 2017-05-10 MED ORDER — PRENATAL MULTIVITAMIN CH
1.0000 | ORAL_TABLET | Freq: Every day | ORAL | Status: DC
  Administered 2017-05-11: 1 via ORAL
  Filled 2017-05-10: qty 1

## 2017-05-10 MED ORDER — ACETAMINOPHEN 325 MG PO TABS: 650.0000 mg | ORAL_TABLET | ORAL | Status: DC | PRN

## 2017-05-10 MED ORDER — ALBUTEROL SULFATE (2.5 MG/3ML) 0.083% IN NEBU: 3.0000 mL | INHALATION_SOLUTION | Freq: Four times a day (QID) | RESPIRATORY_TRACT | Status: DC | PRN

## 2017-05-10 MED ORDER — SODIUM CHLORIDE 0.9% FLUSH
3.0000 mL | Freq: Two times a day (BID) | INTRAVENOUS | Status: DC
  Administered 2017-05-10: 3 mL via INTRAVENOUS

## 2017-05-10 MED ORDER — LABETALOL HCL 300 MG PO TABS
600.0000 mg | ORAL_TABLET | Freq: Three times a day (TID) | ORAL | Status: DC
  Administered 2017-05-10 – 2017-05-11 (×3): 600 mg via ORAL
  Filled 2017-05-10 (×6): qty 2

## 2017-05-10 MED ORDER — DOCUSATE SODIUM 100 MG PO CAPS: 100.0000 mg | ORAL_CAPSULE | Freq: Two times a day (BID) | ORAL | Status: DC | PRN

## 2017-05-10 NOTE — Addendum Note (Signed)
Addended by: Tamela Oddi on: 05/10/2017 03:51 PM   Modules accepted: Orders

## 2017-05-10 NOTE — H&P (Addendum)
Chief Complaint:  Hypertension   First Provider Initiated Contact with Patient 05/10/17 1614      HPI: Laura Gould is a 32 y.o. G1P0 at 97w2dwho presents to maternity admissions reporting elevated BPs and swelling.  Has had meds increased but BPs are still in severe range.. She reports good fetal movement, denies LOF, vaginal bleeding, vaginal itching/burning, urinary symptoms, h/a, dizziness, n/v, diarrhea, constipation or fever/chills.  She denies headache, visual changes or RUQ abdominal pain.  Hypertension  This is a recurrent problem. The problem has been gradually worsening since onset. Associated symptoms include malaise/fatigue and peripheral edema. Pertinent negatives include no anxiety, blurred vision, headaches or shortness of breath. There are no associated agents to hypertension. There are no known risk factors for coronary artery disease. Treatments tried: Labetalol. The current treatment provides no improvement. There are no compliance problems.    RN Note: Sent over from office because of BP. Medication was increased, BP still high. Denies HA, visual changes or epigastric pain. States swelling in legs has increased.   Past Medical History:     Past Medical History:  Diagnosis Date  . Hypertension   . Vaginal Pap smear, abnormal     Past obstetric history:                 OB History  Gravida Para Term Preterm AB Living  1            SAB TAB Ectopic Multiple Live Births                     # Outcome Date GA Lbr Len/2nd Weight Sex Delivery Anes PTL Lv  1 Current             Past Surgical History:      Past Surgical History:  Procedure Laterality Date  . COLPOSCOPY  2016  . TOOTH EXTRACTION      Family History: No family history on file.  Social History: Social History        Tobacco Use  . Smoking status: Never Smoker  . Smokeless tobacco: Never Used  Substance Use Topics  . Alcohol use: No    Frequency:  Never  . Drug use: No    Allergies: No Known Allergies  Meds:  Medications Prior to Admission  Medication Sig Dispense Refill Last Dose  . acetaminophen (TYLENOL) 500 MG tablet Take 500 mg every 6 (six) hours as needed by mouth for moderate pain or headache.   Not Taking  . albuterol (PROVENTIL HFA;VENTOLIN HFA) 108 (90 Base) MCG/ACT inhaler Inhale 1 puff every 6 (six) hours as needed into the lungs for wheezing or shortness of breath.   Not Taking  . aspirin EC 81 MG tablet Take 1 tablet (81 mg total) by mouth daily. Take after 12 weeks for prevention of preeclampsia later in pregnancy 300 tablet 2 Taking  . Elastic Bandages & Supports (COMFORT FIT MATERNITY SUPP LG) MISC 1 Units by Does not apply route daily. (Patient not taking: Reported on 05/10/2017) 1 each 0 Not Taking  . labetalol (NORMODYNE) 200 MG tablet Take 2 tablets (400 mg total) by mouth 3 (three) times daily. 120 tablet 3 Taking  . labetalol (NORMODYNE) 200 MG tablet Take 400 mg by mouth 2 (two) times daily.   Taking  . potassium chloride SA (K-DUR,KLOR-CON) 20 MEQ tablet Take 1 tablet (20 mEq total) by mouth 2 (two) times daily for 2 days. 4 tablet 0   . Prenatal Multivit-Min-Fe-FA (  PRENATAL VITAMINS) 0.8 MG tablet Take 1 tablet by mouth daily. 30 tablet 12 Taking    I have reviewed patient's Past Medical Hx, Surgical Hx, Family Hx, Social Hx, medications and allergies.   ROS:  Review of Systems  Constitutional: Positive for fatigue and malaise/fatigue. Negative for chills and fever.  Eyes: Negative for blurred vision and visual disturbance.  Respiratory: Negative for shortness of breath.   Cardiovascular: Positive for leg swelling.  Gastrointestinal: Negative for constipation and diarrhea.  Genitourinary: Negative for dysuria.  Neurological: Negative for seizures and headaches.   Other systems negative  Physical Exam   Patient Vitals for the past 24 hrs:  BP Temp Temp src Pulse Resp SpO2 Weight   05/10/17 1538 (!) 162/86 97.9 F (36.6 C) Oral 74 18 100 % 207 lb 12 oz (94.2 kg)         Vitals:   05/10/17 1716 05/10/17 1743 05/10/17 1746 05/10/17 1801  BP: (!) 159/94 (!) 169/89 (!) 153/86 (!) 153/83  Pulse: 67 76 75 75  Resp:      Temp:      TempSrc:      SpO2: 99%     Weight:        Weights: 03/31/2017  192 lb 04/20/17        200lb 04/27/17      203 lb 05/03/17      208 lb 05/06/17      206 lb 05/10/17      207 lb  Constitutional: Well-developed, well-nourished female in no acute distress.  Cardiovascular: normal rate and rhythm Respiratory: normal effort, clear to auscultation bilaterally GI: Abd soft, non-tender, gravid appropriate for gestational age.   No rebound or guarding. MS: Extremities nontender, 2-3+ edema from hips to feet, normal ROM Neurologic: Alert and oriented x 4.  GU: Neg CVAT.  PELVIC EXAM: deferred   FHT:  Baseline 140 , moderate variability, accelerations present, no decelerations Contractions:  Irregular, Q 5-6 min   Labs: LabResultsLast24Hours  Results for orders placed or performed during the hospital encounter of 05/10/17 (from the past 24 hour(s))  Protein / creatinine ratio, urine     Status: Abnormal   Collection Time: 05/10/17  3:40 PM  Result Value Ref Range   Creatinine, Urine 49.00 mg/dL   Total Protein, Urine 10 mg/dL   Protein Creatinine Ratio 0.20 (H) 0.00 - 0.15 mg/mg[Cre]  CBC     Status: Abnormal   Collection Time: 05/10/17  4:20 PM  Result Value Ref Range   WBC 5.4 4.0 - 10.5 K/uL   RBC 3.76 (L) 3.87 - 5.11 MIL/uL   Hemoglobin 11.7 (L) 12.0 - 15.0 g/dL   HCT 34.8 (L) 36.0 - 46.0 %   MCV 92.6 78.0 - 100.0 fL   MCH 31.1 26.0 - 34.0 pg   MCHC 33.6 30.0 - 36.0 g/dL   RDW 14.5 11.5 - 15.5 %   Platelets 325 150 - 400 K/uL  Comprehensive metabolic panel     Status: Abnormal   Collection Time: 05/10/17  4:20 PM  Result Value Ref Range   Sodium 139 135 - 145 mmol/L   Potassium  3.1 (L) 3.5 - 5.1 mmol/L   Chloride 106 101 - 111 mmol/L   CO2 22 22 - 32 mmol/L   Glucose, Bld 68 65 - 99 mg/dL   BUN <5 (L) 6 - 20 mg/dL   Creatinine, Ser 0.62 0.44 - 1.00 mg/dL   Calcium 8.7 (L) 8.9 - 10.3 mg/dL   Total  Protein 6.4 (L) 6.5 - 8.1 g/dL   Albumin 2.7 (L) 3.5 - 5.0 g/dL   AST 44 (H) 15 - 41 U/L   ALT 46 14 - 54 U/L   Alkaline Phosphatase 142 (H) 38 - 126 U/L   Total Bilirubin 0.9 0.3 - 1.2 mg/dL   GFR calc non Af Amer >60 >60 mL/min   GFR calc Af Amer >60 >60 mL/min   Anion gap 11 5 - 15       Imaging:  ImagingResults  No results found.    MAU Course/MDM: I have ordered labs and reviewed results. Labs are stable from last check NST reviewed, reassuring with irregular contractions Consult Dr Ilda Basset  with presentation, exam findings and test results.  Treatments in MAU included Labetalol for hypertension.    Assessment: Single IUP at [redacted]w[redacted]d Chronic hypertension with some severe range pressures No overt preeclampsia Pronounced LE edema  Plan: Admit to Antenatal  Dr Ilda Basset will write orders for adjusting BP medications MD to follow  Hansel Feinstein CNM, MSN Certified Nurse-Midwife 05/10/2017 4:14 PM   Seen and agree with above  No s/s of pre-eclampsia or PTL  Patient Vitals for the past 24 hrs:  BP Temp Temp src Pulse Resp SpO2 Weight  05/10/17 1835 (!) 151/81 - - 77 - - -  05/10/17 1801 (!) 153/83 - - 75 - - -  05/10/17 1746 (!) 153/86 - - 75 - - -  05/10/17 1743 (!) 169/89 - - 76 - - -  05/10/17 1716 (!) 159/94 - - 67 - 99 % -  05/10/17 1700 (!) 150/86 - - 75 - 100 % -  05/10/17 1652 (!) 162/90 - - 74 - - -  05/10/17 1538 (!) 162/86 97.9 F (36.6 C) Oral 74 18 100 % 207 lb 12 oz (94.2 kg)   Category I with accels Toco: irregular  NAD  A/p: pt improved *Admit to observation on AP. qshift NSTs *cHTN: no e/o pre-x. pt states was on labetalol 400 tid and takes when wakes up, early to mid afternoon and qhs and took  doses today. Will increase to 600 tid. Pt already received and 200 dose in the mau. appropriately grown on 3/27 and pt due for afi and rpt growth. Will try and get on 4/30.  *BMI 40s: no issues *Preterm: bmz#2 at 1915 on 4/30. NICU aware *FEN/GI: regular diet, sliv *PPx: scds, oob ad lig *Dispo: hopefully tomorrow after u/s and bmz#2 dose if BPs continue to be improved on increased labetalol dose  Durene Romans MD [facility] (Faculty Practice) 05/10/2017 Time: 2015

## 2017-05-10 NOTE — MAU Provider Note (Signed)
Chief Complaint:  Hypertension   First Provider Initiated Contact with Patient 05/10/17 1614      HPI: Laura Gould is a 32 y.o. G1P0 at 45w2dwho presents to maternity admissions reporting elevated BPs and swelling.  Has had meds increased but BPs are still in severe range.. She reports good fetal movement, denies LOF, vaginal bleeding, vaginal itching/burning, urinary symptoms, h/a, dizziness, n/v, diarrhea, constipation or fever/chills.  She denies headache, visual changes or RUQ abdominal pain.  Hypertension  This is a recurrent problem. The problem has been gradually worsening since onset. Associated symptoms include malaise/fatigue and peripheral edema. Pertinent negatives include no anxiety, blurred vision, headaches or shortness of breath. There are no associated agents to hypertension. There are no known risk factors for coronary artery disease. Treatments tried: Labetalol. The current treatment provides no improvement. There are no compliance problems.    RN Note: Sent over from office because of BP.  Medication was increased, BP still high. Denies HA, visual changes or epigastric pain. States swelling in legs has increased.   Past Medical History: Past Medical History:  Diagnosis Date  . Hypertension   . Vaginal Pap smear, abnormal     Past obstetric history: OB History  Gravida Para Term Preterm AB Living  1            SAB TAB Ectopic Multiple Live Births               # Outcome Date GA Lbr Len/2nd Weight Sex Delivery Anes PTL Lv  1 Current             Past Surgical History: Past Surgical History:  Procedure Laterality Date  . COLPOSCOPY  2016  . TOOTH EXTRACTION      Family History: No family history on file.  Social History: Social History   Tobacco Use  . Smoking status: Never Smoker  . Smokeless tobacco: Never Used  Substance Use Topics  . Alcohol use: No    Frequency: Never  . Drug use: No    Allergies: No Known Allergies  Meds:   Medications Prior to Admission  Medication Sig Dispense Refill Last Dose  . acetaminophen (TYLENOL) 500 MG tablet Take 500 mg every 6 (six) hours as needed by mouth for moderate pain or headache.   Not Taking  . albuterol (PROVENTIL HFA;VENTOLIN HFA) 108 (90 Base) MCG/ACT inhaler Inhale 1 puff every 6 (six) hours as needed into the lungs for wheezing or shortness of breath.   Not Taking  . aspirin EC 81 MG tablet Take 1 tablet (81 mg total) by mouth daily. Take after 12 weeks for prevention of preeclampsia later in pregnancy 300 tablet 2 Taking  . Elastic Bandages & Supports (COMFORT FIT MATERNITY SUPP LG) MISC 1 Units by Does not apply route daily. (Patient not taking: Reported on 05/10/2017) 1 each 0 Not Taking  . labetalol (NORMODYNE) 200 MG tablet Take 2 tablets (400 mg total) by mouth 3 (three) times daily. 120 tablet 3 Taking  . labetalol (NORMODYNE) 200 MG tablet Take 400 mg by mouth 2 (two) times daily.   Taking  . potassium chloride SA (K-DUR,KLOR-CON) 20 MEQ tablet Take 1 tablet (20 mEq total) by mouth 2 (two) times daily for 2 days. 4 tablet 0   . Prenatal Multivit-Min-Fe-FA (PRENATAL VITAMINS) 0.8 MG tablet Take 1 tablet by mouth daily. 30 tablet 12 Taking    I have reviewed patient's Past Medical Hx, Surgical Hx, Family Hx, Social Hx, medications and allergies.  ROS:  Review of Systems  Constitutional: Positive for fatigue and malaise/fatigue. Negative for chills and fever.  Eyes: Negative for blurred vision and visual disturbance.  Respiratory: Negative for shortness of breath.   Cardiovascular: Positive for leg swelling.  Gastrointestinal: Negative for constipation and diarrhea.  Genitourinary: Negative for dysuria.  Neurological: Negative for seizures and headaches.   Other systems negative  Physical Exam   Patient Vitals for the past 24 hrs:  BP Temp Temp src Pulse Resp SpO2 Weight  05/10/17 1538 (!) 162/86 97.9 F (36.6 C) Oral 74 18 100 % 207 lb 12 oz (94.2 kg)    Vitals:   05/10/17 1716 05/10/17 1743 05/10/17 1746 05/10/17 1801  BP: (!) 159/94 (!) 169/89 (!) 153/86 (!) 153/83  Pulse: 67 76 75 75  Resp:      Temp:      TempSrc:      SpO2: 99%     Weight:        Weights: 03/31/2017  192 lb 04/20/17        200lb 04/27/17      203 lb 05/03/17      208 lb 05/06/17      206 lb 05/10/17      207 lb  Constitutional: Well-developed, well-nourished female in no acute distress.  Cardiovascular: normal rate and rhythm Respiratory: normal effort, clear to auscultation bilaterally GI: Abd soft, non-tender, gravid appropriate for gestational age.   No rebound or guarding. MS: Extremities nontender, 2-3+ edema from hips to feet, normal ROM Neurologic: Alert and oriented x 4.  GU: Neg CVAT.  PELVIC EXAM: deferred   FHT:  Baseline 140 , moderate variability, accelerations present, no decelerations Contractions:  Irregular, Q 5-6 min   Labs: Results for orders placed or performed during the hospital encounter of 05/10/17 (from the past 24 hour(s))  Protein / creatinine ratio, urine     Status: Abnormal   Collection Time: 05/10/17  3:40 PM  Result Value Ref Range   Creatinine, Urine 49.00 mg/dL   Total Protein, Urine 10 mg/dL   Protein Creatinine Ratio 0.20 (H) 0.00 - 0.15 mg/mg[Cre]  CBC     Status: Abnormal   Collection Time: 05/10/17  4:20 PM  Result Value Ref Range   WBC 5.4 4.0 - 10.5 K/uL   RBC 3.76 (L) 3.87 - 5.11 MIL/uL   Hemoglobin 11.7 (L) 12.0 - 15.0 g/dL   HCT 34.8 (L) 36.0 - 46.0 %   MCV 92.6 78.0 - 100.0 fL   MCH 31.1 26.0 - 34.0 pg   MCHC 33.6 30.0 - 36.0 g/dL   RDW 14.5 11.5 - 15.5 %   Platelets 325 150 - 400 K/uL  Comprehensive metabolic panel     Status: Abnormal   Collection Time: 05/10/17  4:20 PM  Result Value Ref Range   Sodium 139 135 - 145 mmol/L   Potassium 3.1 (L) 3.5 - 5.1 mmol/L   Chloride 106 101 - 111 mmol/L   CO2 22 22 - 32 mmol/L   Glucose, Bld 68 65 - 99 mg/dL   BUN <5 (L) 6 - 20 mg/dL   Creatinine, Ser  0.62 0.44 - 1.00 mg/dL   Calcium 8.7 (L) 8.9 - 10.3 mg/dL   Total Protein 6.4 (L) 6.5 - 8.1 g/dL   Albumin 2.7 (L) 3.5 - 5.0 g/dL   AST 44 (H) 15 - 41 U/L   ALT 46 14 - 54 U/L   Alkaline Phosphatase 142 (H) 38 - 126 U/L  Total Bilirubin 0.9 0.3 - 1.2 mg/dL   GFR calc non Af Amer >60 >60 mL/min   GFR calc Af Amer >60 >60 mL/min   Anion gap 11 5 - 15       Imaging:  No results found.  MAU Course/MDM: I have ordered labs and reviewed results. Labs are stable from last check NST reviewed, reassuring with irregular contractions Consult Dr Ilda Basset  with presentation, exam findings and test results.  Treatments in MAU included Labetalol for hypertension.    Assessment: Single IUP at [redacted]w[redacted]d Chronic hypertension with some severe range pressures No overt preeclampsia Pronounced LE edema  Plan: Admit to Antenatal  Dr Ilda Basset will write orders for adjusting BP medications MD to follow  Hansel Feinstein CNM, MSN Certified Nurse-Midwife 05/10/2017 4:14 PM

## 2017-05-10 NOTE — Progress Notes (Signed)
   PRENATAL VISIT NOTE  Subjective:  Laura Gould is a 32 y.o. G1P0 at [redacted]w[redacted]d being seen today for ongoing prenatal care.  She is currently monitored for the following issues for this high-risk pregnancy and has Adenocarcinoma in situ (AIS) of uterine cervix; Marginal insertion of umbilical cord affecting management of mother; Supervision of normal first pregnancy; Essential hypertension; Mild persistent asthma without complication; Chronic hypertension affecting pregnancy; and Edema during pregnancy on their problem list.  Patient reports lower extremity edema. She denies HA, visual changes, RUQ/epigastric pain.  Contractions: Irregular. Vag. Bleeding: None.  Movement: Present. Denies leaking of fluid.   The following portions of the patient's history were reviewed and updated as appropriate: allergies, current medications, past family history, past medical history, past social history, past surgical history and problem list. Problem list updated.  Objective:   Vitals:   05/10/17 1344 05/10/17 1408  BP: (!) 167/104 (!) 178/111  Pulse: 77   Weight: 207 lb 12.8 oz (94.3 kg)     Fetal Status: Fetal Heart Rate (bpm): NST   Movement: Present     General:  Alert, oriented and cooperative. Patient is in no acute distress.  Skin: Skin is warm and dry. No rash noted.   Cardiovascular: Normal heart rate noted  Respiratory: Normal respiratory effort, no problems with respiration noted  Abdomen: Soft, gravid, appropriate for gestational age.  Pain/Pressure: Present     Pelvic: Cervical exam deferred        Extremities: Normal range of motion.  Edema: Trace  Mental Status: Normal mood and affect. Normal behavior. Normal judgment and thought content.   Assessment and Plan:  Pregnancy: G1P0 at [redacted]w[redacted]d  1. Chronic hypertension affecting pregnancy Severely elevated BP despite labetalol 400 TID Patient sent to MAU for NST, labs and evaluation for preeclampsia Growth ultrasound scheduled  on 5/1 - [place] MFM FETAL BPP WO NON STRESS; Future  2. Encounter for supervision of normal first pregnancy in third trimester Patient is doing well but anxious at the idea of returning to the hospital  Preterm labor symptoms and general obstetric precautions including but not limited to vaginal bleeding, contractions, leaking of fluid and fetal movement were reviewed in detail with the patient. Please refer to After Visit Summary for other counseling recommendations.  Return in about 1 week (around 05/17/2017) for ROB, NST, AFI.  Future Appointments  Date Time Provider [place]  05/12/2017  3:45 PM [place] 5 [facility]-M[facility]US M[facility]-US  05/13/2017  1:00 PM [facility] None  05/17/2017  1:15 PM [facility] ULTRASOUND [facility] None  05/17/2017  2:00 PM Yvonna Brun, MD [facility] None  05/20/2017  1:00 PM [place] None  06/02/2017  1:45 PM [place] 2 [facility]-MFCUS MFC-US    Mora Bellman, MD

## 2017-05-10 NOTE — MAU Note (Signed)
Sent over from office because of BP.  Medication was increased, BP still high. Denies HA, visual changes or epigastric pain. States swelling in legs has increased.

## 2017-05-11 ENCOUNTER — Inpatient Hospital Stay (HOSPITAL_COMMUNITY): Payer: Medicaid Other

## 2017-05-11 DIAGNOSIS — O10913 Unspecified pre-existing hypertension complicating pregnancy, third trimester: Secondary | ICD-10-CM

## 2017-05-11 MED ORDER — LABETALOL HCL 300 MG PO TABS: 600.0000 mg | ORAL_TABLET | Freq: Three times a day (TID) | ORAL | 1 refills | Status: DC

## 2017-05-11 MED ORDER — NIFEDIPINE ER 30 MG PO TB24: 30.0000 mg | ORAL_TABLET | Freq: Two times a day (BID) | ORAL | 1 refills | Status: DC

## 2017-05-11 MED ORDER — NIFEDIPINE ER OSMOTIC RELEASE 30 MG PO TB24: 30.0000 mg | ORAL_TABLET | Freq: Two times a day (BID) | ORAL | Status: DC

## 2017-05-11 MED ORDER — FUROSEMIDE 10 MG/ML IJ SOLN
40.0000 mg | Freq: Once | INTRAMUSCULAR | Status: AC
  Administered 2017-05-11: 40 mg via INTRAVENOUS
  Filled 2017-05-11: qty 4

## 2017-05-11 NOTE — Discharge Instructions (Signed)

## 2017-05-11 NOTE — Progress Notes (Signed)
Pt in [place] to MFM for [place]

## 2017-05-11 NOTE — Discharge Summary (Signed)
Antenatal Physician Discharge Summary  Patient ID: Laura Gould MRN: [id number] DOB/AGE: October 03, 1985 32 y.o.  Admit date: 05/10/2017 Discharge date: 05/11/2017  Admission Diagnoses: chronic hypertension ante- uncontrolled; edema  Discharge Diagnoses: same  Prenatal Procedures: NST and ultrasound  Significant Diagnostic Studies:  Results for orders placed or performed during the hospital encounter of 05/10/17 (from the past 168 hour(s))  Protein / creatinine ratio, urine   Collection Time: 05/10/17  3:40 PM  Result Value Ref Range   Creatinine, Urine 49.00 mg/dL   Total Protein, Urine 10 mg/dL   Protein Creatinine Ratio 0.20 (H) 0.00 - 0.15 mg/mg[Cre]  CBC   Collection Time: 05/10/17  4:20 PM  Result Value Ref Range   WBC 5.4 4.0 - 10.5 K/uL   RBC 3.76 (L) 3.87 - 5.11 MIL/uL   Hemoglobin 11.7 (L) 12.0 - 15.0 g/dL   HCT 34.8 (L) 36.0 - 46.0 %   MCV 92.6 78.0 - 100.0 fL   MCH 31.1 26.0 - 34.0 pg   MCHC 33.6 30.0 - 36.0 g/dL   RDW 14.5 11.5 - 15.5 %   Platelets 325 150 - 400 K/uL  Comprehensive metabolic panel   Collection Time: 05/10/17  4:20 PM  Result Value Ref Range   Sodium 139 135 - 145 mmol/L   Potassium 3.1 (L) 3.5 - 5.1 mmol/L   Chloride 106 101 - 111 mmol/L   CO2 22 22 - 32 mmol/L   Glucose, Bld 68 65 - 99 mg/dL   BUN <5 (L) 6 - 20 mg/dL   Creatinine, Ser 0.62 0.44 - 1.00 mg/dL   Calcium 8.7 (L) 8.9 - 10.3 mg/dL   Total Protein 6.4 (L) 6.5 - 8.1 g/dL   Albumin 2.7 (L) 3.5 - 5.0 g/dL   AST 44 (H) 15 - 41 U/L   ALT 46 14 - 54 U/L   Alkaline Phosphatase 142 (H) 38 - 126 U/L   Total Bilirubin 0.9 0.3 - 1.2 mg/dL   GFR calc non Af Amer >60 >60 mL/min   GFR calc Af Amer >60 >60 mL/min   Anion gap 11 5 - 15  Type and screen [place]   Collection Time: 05/10/17  4:22 PM  Result Value Ref Range   ABO/RH(D) B POS    Antibody Screen NEG    Sample Expiration      05/13/2017 Performed at [facility], [address],  [place], [place] [address]   ABO/Rh   Collection Time: 05/10/17  4:22 PM  Result Value Ref Range   ABO/RH(D)      B POS Performed at [facility], [address] [place], [place] [address]   Results for orders placed or performed during the hospital encounter of 05/06/17 (from the past 168 hour(s))  Protein / creatinine ratio, urine   Collection Time: 05/06/17  4:45 PM  Result Value Ref Range   Creatinine, Urine 51.00 mg/dL   Total Protein, Urine 10 mg/dL   Protein Creatinine Ratio 0.20 (H) 0.00 - 0.15 mg/mg[Cre]  Urinalysis, Routine w reflex microscopic   Collection Time: 05/06/17  4:45 PM  Result Value Ref Range   Color, Urine YELLOW YELLOW   APPearance HAZY (A) CLEAR   Specific Gravity, Urine 1.004 (L) 1.005 - 1.030   pH 7.0 5.0 - 8.0   Glucose, UA NEGATIVE NEGATIVE mg/dL   Hgb urine dipstick NEGATIVE NEGATIVE   Bilirubin Urine NEGATIVE NEGATIVE   Ketones, ur 5 (A) NEGATIVE mg/dL   Protein, ur NEGATIVE NEGATIVE  mg/dL   Nitrite NEGATIVE NEGATIVE   Leukocytes, UA NEGATIVE NEGATIVE  CBC   Collection Time: 05/06/17  5:24 PM  Result Value Ref Range   WBC 6.0 4.0 - 10.5 K/uL   RBC 3.76 (L) 3.87 - 5.11 MIL/uL   Hemoglobin 11.7 (L) 12.0 - 15.0 g/dL   HCT 34.7 (L) 36.0 - 46.0 %   MCV 92.3 78.0 - 100.0 fL   MCH 31.1 26.0 - 34.0 pg   MCHC 33.7 30.0 - 36.0 g/dL   RDW 14.5 11.5 - 15.5 %   Platelets 314 150 - 400 K/uL  Comprehensive metabolic panel   Collection Time: 05/06/17  5:24 PM  Result Value Ref Range   Sodium 141 135 - 145 mmol/L   Potassium 2.7 (LL) 3.5 - 5.1 mmol/L   Chloride 107 101 - 111 mmol/L   CO2 23 22 - 32 mmol/L   Glucose, Bld 88 65 - 99 mg/dL   BUN <5 (L) 6 - 20 mg/dL   Creatinine, Ser 0.58 0.44 - 1.00 mg/dL   Calcium 9.3 8.9 - 10.3 mg/dL   Total Protein 6.4 (L) 6.5 - 8.1 g/dL   Albumin 2.7 (L) 3.5 - 5.0 g/dL   AST 36 15 - 41 U/L   ALT 44 14 - 54 U/L   Alkaline Phosphatase 130 (H) 38 - 126 U/L   Total Bilirubin 0.5 0.3 - 1.2 mg/dL   GFR calc non Af  Amer >60 >60 mL/min   GFR calc Af Amer >60 >60 mL/min   Anion gap 11 5 - 15    Treatments: adjusted BP meds  Hospital Course:  This is a 32 y.o. G1P0 with IUP at [redacted]w[redacted]d admitted for elevated BP. Pt denies Ha or visual changes. Her BP meds were adjusted. Her labetalol was increased.  She received betamethasone x 2 doses.  She was observed, fetal heart rate monitoring remained reassuring, and she had no signs/symptoms of progressing uncontrolled BPs or other maternal-fetal concerns.  She was given one dose of Lasix for her stable bilateral LE edema.   She was deemed stable for discharge to home with outpatient follow up.  Discharge Exam: BP (!) 141/86 (BP Location: Left Arm)   Pulse 82   Temp 97.8 F (36.6 C) (Oral)   Resp 18   Ht 4\' 10"  (1.473 m)   Wt 207 lb 12 oz (94.2 kg)   LMP 09/05/2016 (Approximate)   SpO2 99%   BMI 43.42 kg/m  General appearance: alert and no distress Resp: clear to auscultation bilaterally Cardio: regular rate and rhythm, S1, S2 normal, no murmur, click, rub or gallop GI: soft, non-tender; bowel sounds normal; no masses,  no organomegaly and gravid Extremities: edema 2+ pitting edema symmetric.   Discharge Condition: good  Disposition: Discharge disposition: 01-Home or Self Care       Discharge Instructions    Discharge activity:  No Restrictions   Complete by:  As directed    Discharge diet:   Complete by:  As directed    Heart healthy   No sexual activity restrictions   Complete by:  As directed    Notify physician for a general feeling that "something is not right"   Complete by:  As directed    Notify physician for increase or change in vaginal discharge   Complete by:  As directed    Notify physician for intestinal cramps, with or without diarrhea, sometimes described as "gas pain"   Complete by:  As directed  Notify physician for leaking of fluid   Complete by:  As directed    Notify physician for low, dull backache, unrelieved by  heat or Tylenol   Complete by:  As directed    Notify physician for menstrual like cramps   Complete by:  As directed    Notify physician for pelvic pressure   Complete by:  As directed    Notify physician for uterine contractions.  These may be painless and feel like the uterus is tightening or the baby is  "balling up"   Complete by:  As directed    Notify physician for vaginal bleeding   Complete by:  As directed    PRETERM LABOR:  Includes any of the follwing symptoms that occur between 20 - [redacted] weeks gestation.  If these symptoms are not stopped, preterm labor can result in preterm delivery, placing your baby at risk   Complete by:  As directed      Allergies as of 05/11/2017   No Known Allergies     Medication List    STOP taking these medications   acetaminophen 500 MG tablet Commonly known as:  TYLENOL   COMFORT FIT MATERNITY SUPP LG Misc   potassium chloride SA 20 MEQ tablet Commonly known as:  K-DUR,KLOR-CON   sodium chloride 0.65 % Soln nasal spray Commonly known as:  OCEAN     TAKE these medications   albuterol 108 (90 Base) MCG/ACT inhaler Commonly known as:  PROVENTIL HFA;VENTOLIN HFA Inhale 1 puff every 6 (six) hours as needed into the lungs for wheezing or shortness of breath.   aspirin EC 81 MG tablet Take 1 tablet (81 mg total) by mouth daily. Take after 12 weeks for prevention of preeclampsia later in pregnancy   labetalol 300 MG tablet Commonly known as:  NORMODYNE Take 2 tablets (600 mg total) by mouth 3 (three) times daily. What changed:    medication strength  how much to take   NIFEdipine 30 MG 24 hr tablet Commonly known as:  PROCARDIA-XL/ADALAT CC Take 1 tablet (30 mg total) by mouth 2 (two) times daily.   Prenatal Vitamins 0.8 MG tablet Take 1 tablet by mouth daily.      Follow-up Information    [facility] Follow up in 6 day(s).   Specialty:  Obstetrics and Gynecology Why:  Pt aready has an appt May 6th Contact  information: [address] [place] [address] [place] [phone]          Signed: Lavonia Drafts M.D. 05/11/2017, 4:10 PM

## 2017-05-11 NOTE — Progress Notes (Signed)
PT refused CMP and CBC

## 2017-05-11 NOTE — Progress Notes (Signed)
Discharge instructions given to patient.  RN explained medication changes and times to patient.  Rn reviewed follow up appointments and symptoms of hypertension in pregnancy.

## 2017-05-11 NOTE — Progress Notes (Signed)
Dr. Ilda Basset notified pt voided in toilet and 24 hr urine needs to be restarted.   Dr. Waldemar Dickens hold off on 24 hr urine

## 2017-05-12 ENCOUNTER — Ambulatory Visit (HOSPITAL_COMMUNITY): Payer: Medicaid Other

## 2017-05-13 ENCOUNTER — Other Ambulatory Visit: Payer: Medicaid Other

## 2017-05-14 ENCOUNTER — Encounter (HOSPITAL_COMMUNITY): Payer: Self-pay | Admitting: *Deleted

## 2017-05-14 ENCOUNTER — Inpatient Hospital Stay (HOSPITAL_COMMUNITY)
Admission: AD | Admit: 2017-05-14 | Discharge: 2017-05-18 | DRG: 807 | Disposition: A | Discharge status: Home / Self Care | Payer: Medicaid Other | Source: Ambulatory Visit | Attending: Obstetrics & Gynecology | Admitting: Obstetrics & Gynecology

## 2017-05-14 DIAGNOSIS — Z302 Encounter for sterilization: Secondary | ICD-10-CM

## 2017-05-14 DIAGNOSIS — J45909 Unspecified asthma, uncomplicated: Secondary | ICD-10-CM | POA: Diagnosis present

## 2017-05-14 DIAGNOSIS — O9952 Diseases of the respiratory system complicating childbirth: Secondary | ICD-10-CM | POA: Diagnosis present

## 2017-05-14 DIAGNOSIS — O1423 HELLP syndrome (HELLP), third trimester: Secondary | ICD-10-CM

## 2017-05-14 DIAGNOSIS — O1002 Pre-existing essential hypertension complicating childbirth: Secondary | ICD-10-CM | POA: Diagnosis present

## 2017-05-14 DIAGNOSIS — O10919 Unspecified pre-existing hypertension complicating pregnancy, unspecified trimester: Secondary | ICD-10-CM

## 2017-05-14 DIAGNOSIS — O99214 Obesity complicating childbirth: Secondary | ICD-10-CM | POA: Diagnosis present

## 2017-05-14 DIAGNOSIS — Z3A35 35 weeks gestation of pregnancy: Secondary | ICD-10-CM | POA: Diagnosis not present

## 2017-05-14 DIAGNOSIS — O0990 Supervision of high risk pregnancy, unspecified, unspecified trimester: Secondary | ICD-10-CM

## 2017-05-14 DIAGNOSIS — O119 Pre-existing hypertension with pre-eclampsia, unspecified trimester: Secondary | ICD-10-CM | POA: Diagnosis present

## 2017-05-14 DIAGNOSIS — O114 Pre-existing hypertension with pre-eclampsia, complicating childbirth: Secondary | ICD-10-CM | POA: Diagnosis present

## 2017-05-14 DIAGNOSIS — I361 Nonrheumatic tricuspid (valve) insufficiency: Secondary | ICD-10-CM | POA: Diagnosis not present

## 2017-05-14 DIAGNOSIS — Z3A36 36 weeks gestation of pregnancy: Secondary | ICD-10-CM | POA: Diagnosis not present

## 2017-05-14 DIAGNOSIS — O1202 Gestational edema, second trimester: Secondary | ICD-10-CM

## 2017-05-14 HISTORY — DX: Unspecified asthma, uncomplicated: J45.909

## 2017-05-14 LAB — URINALYSIS, ROUTINE W REFLEX MICROSCOPIC
BILIRUBIN URINE: NEGATIVE
GLUCOSE, UA: NEGATIVE mg/dL
HGB URINE DIPSTICK: NEGATIVE
Ketones, ur: NEGATIVE mg/dL
Leukocytes, UA: NEGATIVE
Nitrite: NEGATIVE
PH: 7 (ref 5.0–8.0)
Protein, ur: NEGATIVE mg/dL
SPECIFIC GRAVITY, URINE: 1.005 (ref 1.005–1.030)

## 2017-05-14 LAB — PROTEIN / CREATININE RATIO, URINE
Creatinine, Urine: 40 mg/dL
Protein Creatinine Ratio: 0.2 mg/mg{Cre} — ABNORMAL HIGH (ref 0.00–0.15)
Total Protein, Urine: 8 mg/dL

## 2017-05-14 LAB — CBC WITH DIFFERENTIAL/PLATELET
BASOS ABS: 0 10*3/uL (ref 0.0–0.1)
BASOS PCT: 1 %
EOS ABS: 0 10*3/uL (ref 0.0–0.7)
Eosinophils Relative: 0 %
HCT: 44.8 % (ref 36.0–46.0)
HEMOGLOBIN: 15.3 g/dL — AB (ref 12.0–15.0)
LYMPHS ABS: 0.7 10*3/uL (ref 0.7–4.0)
Lymphocytes Relative: 20 %
MCH: 31.2 pg (ref 26.0–34.0)
MCHC: 34.2 g/dL (ref 30.0–36.0)
MCV: 91.4 fL (ref 78.0–100.0)
Monocytes Absolute: 0.3 10*3/uL (ref 0.1–1.0)
Monocytes Relative: 8 %
NEUTROS PCT: 71 %
Neutro Abs: 2.6 10*3/uL (ref 1.7–7.7)
PLATELETS: 207 10*3/uL (ref 150–400)
RBC: 4.9 MIL/uL (ref 3.87–5.11)
RDW: 14.4 % (ref 11.5–15.5)
WBC: 3.6 10*3/uL — AB (ref 4.0–10.5)

## 2017-05-14 LAB — COMPREHENSIVE METABOLIC PANEL
ALBUMIN: 2.5 g/dL — AB (ref 3.5–5.0)
ALK PHOS: 126 U/L (ref 38–126)
ALT: 150 U/L — AB (ref 14–54)
AST: 120 U/L — ABNORMAL HIGH (ref 15–41)
Anion gap: 12 (ref 5–15)
BUN: UNDETERMINED mg/dL (ref 6–20)
CALCIUM: 8.7 mg/dL — AB (ref 8.9–10.3)
CHLORIDE: 112 mmol/L — AB (ref 101–111)
CO2: 16 mmol/L — AB (ref 22–32)
CREATININE: 0.57 mg/dL (ref 0.44–1.00)
GFR calc Af Amer: >60 mL/min (ref >60)
GFR calc non Af Amer: >60 mL/min (ref >60)
Glucose, Bld: 85 mg/dL (ref 65–99)
Potassium: 3.7 mmol/L (ref 3.5–5.1)
SODIUM: 140 mmol/L (ref 135–145)
Total Bilirubin: 1 mg/dL (ref 0.3–1.2)
Total Protein: 5.7 g/dL — ABNORMAL LOW (ref 6.5–8.1)

## 2017-05-14 LAB — POCT FERN TEST: POCT Fern Test: NEGATIVE

## 2017-05-14 MED ORDER — NIFEDIPINE ER OSMOTIC RELEASE 30 MG PO TB24
30.0000 mg | ORAL_TABLET | Freq: Two times a day (BID) | ORAL | Status: DC
  Administered 2017-05-15 – 2017-05-16 (×3): 30 mg via ORAL
  Filled 2017-05-14 (×6): qty 1

## 2017-05-14 MED ORDER — ACETAMINOPHEN 325 MG PO TABS
650.0000 mg | ORAL_TABLET | ORAL | Status: DC | PRN
  Administered 2017-05-15: 650 mg via ORAL
  Filled 2017-05-14: qty 2

## 2017-05-14 MED ORDER — LACTATED RINGERS IV SOLN
500.0000 mL | INTRAVENOUS | Status: DC | PRN
  Administered 2017-05-14: 1000 mL via INTRAVENOUS

## 2017-05-14 MED ORDER — PENICILLIN G POT IN DEXTROSE 60000 UNIT/ML IV SOLN
3.0000 10*6.[IU] | INTRAVENOUS | Status: DC
  Administered 2017-05-15 – 2017-05-16 (×8): 3 10*6.[IU] via INTRAVENOUS
  Filled 2017-05-14 (×13): qty 50

## 2017-05-14 MED ORDER — MISOPROSTOL 50MCG HALF TABLET
50.0000 ug | ORAL_TABLET | ORAL | Status: DC | PRN
  Administered 2017-05-15 (×2): 50 ug via BUCCAL
  Filled 2017-05-14 (×3): qty 1

## 2017-05-14 MED ORDER — ONDANSETRON HCL 4 MG/2ML IJ SOLN
4.0000 mg | Freq: Four times a day (QID) | INTRAMUSCULAR | Status: DC | PRN
  Administered 2017-05-15: 4 mg via INTRAVENOUS
  Filled 2017-05-14: qty 2

## 2017-05-14 MED ORDER — ZOLPIDEM TARTRATE 5 MG PO TABS: 5.0000 mg | ORAL_TABLET | Freq: Every evening | ORAL | Status: DC | PRN

## 2017-05-14 MED ORDER — OXYTOCIN BOLUS FROM INFUSION
500.0000 mL | Freq: Once | INTRAVENOUS | Status: AC
  Administered 2017-05-16: 500 mL via INTRAVENOUS

## 2017-05-14 MED ORDER — LABETALOL HCL 5 MG/ML IV SOLN: 20.0000 mg | INTRAVENOUS | Status: DC | PRN

## 2017-05-14 MED ORDER — MAGNESIUM SULFATE BOLUS VIA INFUSION
4.0000 g | Freq: Once | INTRAVENOUS | Status: AC
  Administered 2017-05-14: 4 g via INTRAVENOUS
  Filled 2017-05-14: qty 500

## 2017-05-14 MED ORDER — OXYCODONE-ACETAMINOPHEN 5-325 MG PO TABS: 2.0000 | ORAL_TABLET | ORAL | Status: DC | PRN

## 2017-05-14 MED ORDER — SOD CITRATE-CITRIC ACID 500-334 MG/5ML PO SOLN: 30.0000 mL | ORAL | Status: DC | PRN

## 2017-05-14 MED ORDER — TERBUTALINE SULFATE 1 MG/ML IJ SOLN: 0.2500 mg | Freq: Once | INTRAMUSCULAR | Status: DC | PRN

## 2017-05-14 MED ORDER — LACTATED RINGERS IV SOLN
INTRAVENOUS | Status: DC
  Administered 2017-05-15 – 2017-05-16 (×2): via INTRAVENOUS

## 2017-05-14 MED ORDER — SODIUM CHLORIDE 0.9 % IV SOLN
5.0000 10*6.[IU] | Freq: Once | INTRAVENOUS | Status: AC
  Administered 2017-05-15: 5 10*6.[IU] via INTRAVENOUS
  Filled 2017-05-14: qty 5

## 2017-05-14 MED ORDER — HYDRALAZINE HCL 20 MG/ML IJ SOLN: 10.0000 mg | Freq: Once | INTRAMUSCULAR | Status: DC | PRN

## 2017-05-14 MED ORDER — FENTANYL CITRATE (PF) 100 MCG/2ML IJ SOLN
100.0000 ug | INTRAMUSCULAR | Status: DC | PRN
  Administered 2017-05-15 (×2): 100 ug via INTRAVENOUS
  Filled 2017-05-14 (×2): qty 2

## 2017-05-14 MED ORDER — OXYCODONE-ACETAMINOPHEN 5-325 MG PO TABS: 1.0000 | ORAL_TABLET | ORAL | Status: DC | PRN

## 2017-05-14 MED ORDER — ALBUTEROL SULFATE (2.5 MG/3ML) 0.083% IN NEBU: 3.0000 mL | INHALATION_SOLUTION | Freq: Four times a day (QID) | RESPIRATORY_TRACT | Status: DC | PRN

## 2017-05-14 MED ORDER — LABETALOL HCL 200 MG PO TABS
600.0000 mg | ORAL_TABLET | Freq: Three times a day (TID) | ORAL | Status: DC
  Administered 2017-05-15 (×3): 600 mg via ORAL
  Filled 2017-05-14 (×3): qty 3

## 2017-05-14 MED ORDER — MAGNESIUM SULFATE 40 G IN LACTATED RINGERS - SIMPLE
2.0000 g/h | INTRAVENOUS | Status: AC
  Administered 2017-05-15: 2 g/h via INTRAVENOUS
  Filled 2017-05-14 (×2): qty 500
  Filled 2017-05-14: qty 40

## 2017-05-14 MED ORDER — OXYTOCIN 40 UNITS IN LACTATED RINGERS INFUSION - SIMPLE MED
2.5000 [IU]/h | INTRAVENOUS | Status: DC
  Administered 2017-05-16: 2.5 [IU]/h via INTRAVENOUS
  Filled 2017-05-14 (×2): qty 1000

## 2017-05-14 MED ORDER — LACTATED RINGERS IV SOLN: INTRAVENOUS | Status: DC

## 2017-05-14 MED ORDER — NIFEDIPINE ER OSMOTIC RELEASE 30 MG PO TB24
30.0000 mg | ORAL_TABLET | Freq: Once | ORAL | Status: AC
  Administered 2017-05-14: 30 mg via ORAL
  Filled 2017-05-14: qty 1

## 2017-05-14 MED ORDER — LABETALOL HCL 300 MG PO TABS
300.0000 mg | ORAL_TABLET | Freq: Once | ORAL | Status: AC
  Administered 2017-05-14: 300 mg via ORAL
  Filled 2017-05-14: qty 1

## 2017-05-14 MED ORDER — LIDOCAINE HCL (PF) 1 % IJ SOLN
30.0000 mL | INTRAMUSCULAR | Status: DC | PRN
  Filled 2017-05-14: qty 30

## 2017-05-14 NOTE — Progress Notes (Signed)
Patient refusing blood draw.  Educated patient about risks of preeclampsia to mom and baby.  Pt agreeing to let phlebotomist "look" and she'll decide if she will let him try to get blood.  Provider aware.

## 2017-05-14 NOTE — Progress Notes (Addendum)
Assumed care. Lab at bs drawing via capillary dt pt swelling and fear of needle sticks   2125: assisted pt up to bathroom. SO to bathroom with patient.   2132: EFM applied. BP cuff replaced.    2146: assisted pt from bed to bathroom.   2220: up to bathroom again  2315: GBS culture done   2323: relinquished care over to [facility]

## 2017-05-14 NOTE — H&P (Signed)
History     CSN: [id number]  Arrival date and time: 05/14/17 1933   First Provider Initiated Contact with Patient 05/14/17 2021      Chief Complaint  Patient presents with  . Contractions  . Vaginal Discharge   HPI [name] is a 32 y.o. G1P0 at [redacted]w[redacted]d who presents to MAU today with complaint of intermittent contractions and LOF. The patient has poorly controlled CHTN and is taking labetalol and procardia. Last dose of labetalol was this afternoon and procardia this morning. She denies headache, visual changes, RUQ pain or vaginal bleeding. She has been having back pain and LOF "all day". She reports normal fetal movement. She also endorses significant peripheral edema that is unchanged today from previously.    OB History    Gravida  1   Para      Term      Preterm      AB      Living        SAB      TAB      Ectopic      Multiple      Live Births              Past Medical History:  Diagnosis Date  . Asthma   . Hypertension    gestational  . Vaginal Pap smear, abnormal     Past Surgical History:  Procedure Laterality Date  . COLPOSCOPY  2016  . TOOTH EXTRACTION      History reviewed. No pertinent family history.  Social History   Tobacco Use  . Smoking status: Never Smoker  . Smokeless tobacco: Never Used  Substance Use Topics  . Alcohol use: No    Frequency: Never  . Drug use: No    Allergies: No Known Allergies  Medications Prior to Admission  Medication Sig Dispense Refill Last Dose  . albuterol (PROVENTIL HFA;VENTOLIN HFA) 108 (90 Base) MCG/ACT inhaler Inhale 1 puff every 6 (six) hours as needed into the lungs for wheezing or shortness of breath.   05/13/2017 at Unknown time  . aspirin EC 81 MG tablet Take 1 tablet (81 mg total) by mouth daily. Take after 12 weeks for prevention of preeclampsia later in pregnancy 300 tablet 2 05/14/2017 at Unknown time  . labetalol (NORMODYNE) 300 MG tablet Take 2 tablets (600 mg  total) by mouth 3 (three) times daily. 180 tablet 1 05/14/2017 at 1200  . NIFEdipine (PROCARDIA-XL/ADALAT CC) 30 MG 24 hr tablet Take 1 tablet (30 mg total) by mouth 2 (two) times daily. 60 tablet 1 05/14/2017 at Unknown time  . Prenatal Multivit-Min-Fe-FA (PRENATAL VITAMINS) 0.8 MG tablet Take 1 tablet by mouth daily. 30 tablet 12 05/14/2017 at Unknown time    Review of Systems  Constitutional: Negative for fever.  Gastrointestinal: Negative for abdominal pain, constipation, diarrhea, nausea and vomiting.  Genitourinary: Positive for vaginal discharge. Negative for vaginal bleeding.  Musculoskeletal: Positive for back pain.   Physical Exam   Blood pressure (!) 173/92, pulse 69, temperature 98.6 F (37 C), resp. rate 18, height 4\' 10"  (1.473 m), weight 214 lb (97.1 kg), last menstrual period 09/05/2016, SpO2 97 %.  Physical Exam  Nursing note and vitals reviewed. Constitutional: She is oriented to person, place, and time. She appears well-developed and well-nourished. No distress.  HENT:  Head: Normocephalic and atraumatic.  Cardiovascular: Normal rate.  Respiratory: Effort normal.  GI: Soft. She exhibits no distension and no mass. There is no tenderness. There is  no rebound and no guarding.  Genitourinary: Uterus is enlarged. Uterus is not tender. Cervix exhibits no motion tenderness, no discharge and no friability. No bleeding in the vagina. Vaginal discharge (small, mucous) found.  Musculoskeletal: She exhibits edema (significant peripheral 2+ pitting edema noted ).  Neurological: She is alert and oriented to person, place, and time.  Skin: Skin is warm and dry. No erythema.  Psychiatric: She has a normal mood and affect.  Dilation: Closed Exam by:: Kerry Hough, PA-C   Results for orders placed or performed during the hospital encounter of 05/14/17 (from the past 24 hour(s))  Urinalysis, Routine w reflex microscopic     Status: None   Collection Time: 05/14/17  8:20 PM  Result Value  Ref Range   Color, Urine YELLOW YELLOW   APPearance CLEAR CLEAR   Specific Gravity, Urine 1.005 1.005 - 1.030   pH 7.0 5.0 - 8.0   Glucose, UA NEGATIVE NEGATIVE mg/dL   Hgb urine dipstick NEGATIVE NEGATIVE   Bilirubin Urine NEGATIVE NEGATIVE   Ketones, ur NEGATIVE NEGATIVE mg/dL   Protein, ur NEGATIVE NEGATIVE mg/dL   Nitrite NEGATIVE NEGATIVE   Leukocytes, UA NEGATIVE NEGATIVE  Protein / creatinine ratio, urine     Status: Abnormal   Collection Time: 05/14/17  8:20 PM  Result Value Ref Range   Creatinine, Urine 40.00 mg/dL   Total Protein, Urine 8 mg/dL   Protein Creatinine Ratio 0.20 (H) 0.00 - 0.15 mg/mg[Cre]  Fern Test     Status: None   Collection Time: 05/14/17  8:58 PM  Result Value Ref Range   POCT Fern Test Negative = intact amniotic membranes   CBC with Differential/Platelet     Status: Abnormal   Collection Time: 05/14/17  9:18 PM  Result Value Ref Range   WBC 3.6 (L) 4.0 - 10.5 K/uL   RBC 4.90 3.87 - 5.11 MIL/uL   Hemoglobin 15.3 (H) 12.0 - 15.0 g/dL   HCT 44.8 36.0 - 46.0 %   MCV 91.4 78.0 - 100.0 fL   MCH 31.2 26.0 - 34.0 pg   MCHC 34.2 30.0 - 36.0 g/dL   RDW 14.4 11.5 - 15.5 %   Platelets 207 150 - 400 K/uL   Neutrophils Relative % 71 %   Neutro Abs 2.6 1.7 - 7.7 K/uL   Lymphocytes Relative 20 %   Lymphs Abs 0.7 0.7 - 4.0 K/uL   Monocytes Relative 8 %   Monocytes Absolute 0.3 0.1 - 1.0 K/uL   Eosinophils Relative 0 %   Eosinophils Absolute 0.0 0.0 - 0.7 K/uL   Basophils Relative 1 %   Basophils Absolute 0.0 0.0 - 0.1 K/uL  Comprehensive metabolic panel     Status: Abnormal   Collection Time: 05/14/17  9:18 PM  Result Value Ref Range   Sodium 140 135 - 145 mmol/L   Potassium 3.7 3.5 - 5.1 mmol/L   Chloride 112 (H) 101 - 111 mmol/L   CO2 16 (L) 22 - 32 mmol/L   Glucose, Bld 85 65 - 99 mg/dL   BUN QUANTITY NOT SUFFICIENT, UNABLE TO PERFORM TEST 6 - 20 mg/dL   Creatinine, Ser 0.57 0.44 - 1.00 mg/dL   Calcium 8.7 (L) 8.9 - 10.3 mg/dL   Total Protein  5.7 (L) 6.5 - 8.1 g/dL   Albumin 2.5 (L) 3.5 - 5.0 g/dL   AST 120 (H) 15 - 41 U/L   ALT 150 (H) 14 - 54 U/L   Alkaline Phosphatase 126 38 - 126  U/L   Total Bilirubin 1.0 0.3 - 1.2 mg/dL   GFR calc non Af Amer >60 >60 mL/min   GFR calc Af Amer >60 >60 mL/min   Anion gap 12 5 - 15    Fetal Monitoring: Baseline: 130 bpm Variability: moderate Accelerations: 15 x 15 Decelerations: none Contractions: few, irregular, mild  Patient Vitals for the past 24 hrs:  BP Temp Pulse Resp SpO2 Height Weight  05/14/17 2233 (!) 173/92 - 69 - - - -  05/14/17 2222 (!) 155/96 - 68 - - - -  05/14/17 2201 (!) 152/83 - 64 - - - -  05/14/17 2152 (!) 158/87 - 72 - - - -  05/14/17 2139 - - - - 97 % - -  05/14/17 2134 - - - - 99 % - -  05/14/17 2133 (!) 159/92 - 63 - - - -  05/14/17 2045 (!) 162/90 - (!) 58 - - - -  05/14/17 2015 (!) 177/91 - 66 - - - -  05/14/17 1953 (!) 154/91 98.6 F (37 C) 75 18 100 % 4\' 10"  (1.473 m) 214 lb (97.1 kg)    MAU Course  Procedures None  MDM Serial BPs CBC, CMP, UA and urine protein/creatinine ratio  Fern - negative  Discussed patient with Dr. Kerry Dory. Admit for IOL for CHTN with severe superimposed pre-eclampsia.  Assessment and Plan  A: SIUP at [redacted]w[redacted]d Membranes intact  CHTN with severe superimposed pre-eclampsia   P:  Admit IOL   Kerry Hough, PA-C 05/14/2017, 11:04 PM

## 2017-05-14 NOTE — MAU Provider Note (Signed)
History     CSN: [id number]  Arrival date and time: 05/14/17 1933   First Provider Initiated Contact with Patient 05/14/17 2021      Chief Complaint  Patient presents with  . Contractions  . Vaginal Discharge   HPI [name] is a 32 y.o. G1P0 at [redacted]w[redacted]d who presents to MAU today with complaint of intermittent contractions and LOF. The patient has poorly controlled CHTN and is taking labetalol and procardia. Last dose of labetalol was this afternoon and procardia this morning. She denies headache, visual changes, RUQ pain or vaginal bleeding. She has been having back pain and LOF "all day". She reports normal fetal movement. She also endorses significant peripheral edema that is unchanged today from previously.   OB History    Gravida  1   Para      Term      Preterm      AB      Living        SAB      TAB      Ectopic      Multiple      Live Births              Past Medical History:  Diagnosis Date  . Asthma   . Hypertension    gestational  . Vaginal Pap smear, abnormal     Past Surgical History:  Procedure Laterality Date  . COLPOSCOPY  2016  . TOOTH EXTRACTION      History reviewed. No pertinent family history.  Social History   Tobacco Use  . Smoking status: Never Smoker  . Smokeless tobacco: Never Used  Substance Use Topics  . Alcohol use: No    Frequency: Never  . Drug use: No    Allergies: No Known Allergies  Medications Prior to Admission  Medication Sig Dispense Refill Last Dose  . albuterol (PROVENTIL HFA;VENTOLIN HFA) 108 (90 Base) MCG/ACT inhaler Inhale 1 puff every 6 (six) hours as needed into the lungs for wheezing or shortness of breath.   05/13/2017 at Unknown time  . aspirin EC 81 MG tablet Take 1 tablet (81 mg total) by mouth daily. Take after 12 weeks for prevention of preeclampsia later in pregnancy 300 tablet 2 05/14/2017 at Unknown time  . labetalol (NORMODYNE) 300 MG tablet Take 2 tablets (600 mg total)  by mouth 3 (three) times daily. 180 tablet 1 05/14/2017 at 1200  . NIFEdipine (PROCARDIA-XL/ADALAT CC) 30 MG 24 hr tablet Take 1 tablet (30 mg total) by mouth 2 (two) times daily. 60 tablet 1 05/14/2017 at Unknown time  . Prenatal Multivit-Min-Fe-FA (PRENATAL VITAMINS) 0.8 MG tablet Take 1 tablet by mouth daily. 30 tablet 12 05/14/2017 at Unknown time    Review of Systems  Constitutional: Negative for fever.  Gastrointestinal: Negative for abdominal pain, constipation, diarrhea, nausea and vomiting.  Genitourinary: Positive for vaginal discharge. Negative for vaginal bleeding.  Musculoskeletal: Positive for back pain.   Physical Exam   Blood pressure (!) 173/92, pulse 69, temperature 98.6 F (37 C), resp. rate 18, height 4\' 10"  (1.473 m), weight 214 lb (97.1 kg), last menstrual period 09/05/2016, SpO2 97 %.  Physical Exam  Nursing note and vitals reviewed. Constitutional: She is oriented to person, place, and time. She appears well-developed and well-nourished. No distress.  HENT:  Head: Normocephalic and atraumatic.  Cardiovascular: Normal rate.  Respiratory: Effort normal.  GI: Soft. She exhibits no distension and no mass. There is no tenderness. There is no  rebound and no guarding.  Genitourinary: Uterus is enlarged. Uterus is not tender. Cervix exhibits no motion tenderness, no discharge and no friability. No bleeding in the vagina. Vaginal discharge (small, mucous) found.  Neurological: She is alert and oriented to person, place, and time.  Skin: Skin is warm and dry. No erythema.  Psychiatric: She has a normal mood and affect.  Dilation: Closed Exam by:: Kerry Hough, PA-C   Results for orders placed or performed during the hospital encounter of 05/14/17 (from the past 24 hour(s))  Urinalysis, Routine w reflex microscopic     Status: None   Collection Time: 05/14/17  8:20 PM  Result Value Ref Range   Color, Urine YELLOW YELLOW   APPearance CLEAR CLEAR   Specific Gravity, Urine  1.005 1.005 - 1.030   pH 7.0 5.0 - 8.0   Glucose, UA NEGATIVE NEGATIVE mg/dL   Hgb urine dipstick NEGATIVE NEGATIVE   Bilirubin Urine NEGATIVE NEGATIVE   Ketones, ur NEGATIVE NEGATIVE mg/dL   Protein, ur NEGATIVE NEGATIVE mg/dL   Nitrite NEGATIVE NEGATIVE   Leukocytes, UA NEGATIVE NEGATIVE  Protein / creatinine ratio, urine     Status: Abnormal   Collection Time: 05/14/17  8:20 PM  Result Value Ref Range   Creatinine, Urine 40.00 mg/dL   Total Protein, Urine 8 mg/dL   Protein Creatinine Ratio 0.20 (H) 0.00 - 0.15 mg/mg[Cre]  Fern Test     Status: None   Collection Time: 05/14/17  8:58 PM  Result Value Ref Range   POCT Fern Test Negative = intact amniotic membranes   CBC with Differential/Platelet     Status: Abnormal   Collection Time: 05/14/17  9:18 PM  Result Value Ref Range   WBC 3.6 (L) 4.0 - 10.5 K/uL   RBC 4.90 3.87 - 5.11 MIL/uL   Hemoglobin 15.3 (H) 12.0 - 15.0 g/dL   HCT 44.8 36.0 - 46.0 %   MCV 91.4 78.0 - 100.0 fL   MCH 31.2 26.0 - 34.0 pg   MCHC 34.2 30.0 - 36.0 g/dL   RDW 14.4 11.5 - 15.5 %   Platelets 207 150 - 400 K/uL   Neutrophils Relative % 71 %   Neutro Abs 2.6 1.7 - 7.7 K/uL   Lymphocytes Relative 20 %   Lymphs Abs 0.7 0.7 - 4.0 K/uL   Monocytes Relative 8 %   Monocytes Absolute 0.3 0.1 - 1.0 K/uL   Eosinophils Relative 0 %   Eosinophils Absolute 0.0 0.0 - 0.7 K/uL   Basophils Relative 1 %   Basophils Absolute 0.0 0.0 - 0.1 K/uL  Comprehensive metabolic panel     Status: Abnormal   Collection Time: 05/14/17  9:18 PM  Result Value Ref Range   Sodium 140 135 - 145 mmol/L   Potassium 3.7 3.5 - 5.1 mmol/L   Chloride 112 (H) 101 - 111 mmol/L   CO2 16 (L) 22 - 32 mmol/L   Glucose, Bld 85 65 - 99 mg/dL   BUN QUANTITY NOT SUFFICIENT, UNABLE TO PERFORM TEST 6 - 20 mg/dL   Creatinine, Ser 0.57 0.44 - 1.00 mg/dL   Calcium 8.7 (L) 8.9 - 10.3 mg/dL   Total Protein 5.7 (L) 6.5 - 8.1 g/dL   Albumin 2.5 (L) 3.5 - 5.0 g/dL   AST 120 (H) 15 - 41 U/L   ALT 150  (H) 14 - 54 U/L   Alkaline Phosphatase 126 38 - 126 U/L   Total Bilirubin 1.0 0.3 - 1.2 mg/dL   GFR  calc non Af Amer >60 >60 mL/min   GFR calc Af Amer >60 >60 mL/min   Anion gap 12 5 - 15    Fetal Monitoring: Baseline: 130 bpm Variability: moderate Accelerations: 15 x 15 Decelerations: none Contractions: few, irregular, mild  Patient Vitals for the past 24 hrs:  BP Temp Pulse Resp SpO2 Height Weight  05/14/17 2233 (!) 173/92 - 69 - - - -  05/14/17 2222 (!) 155/96 - 68 - - - -  05/14/17 2201 (!) 152/83 - 64 - - - -  05/14/17 2152 (!) 158/87 - 72 - - - -  05/14/17 2139 - - - - 97 % - -  05/14/17 2134 - - - - 99 % - -  05/14/17 2133 (!) 159/92 - 63 - - - -  05/14/17 2045 (!) 162/90 - (!) 58 - - - -  05/14/17 2015 (!) 177/91 - 66 - - - -  05/14/17 1953 (!) 154/91 98.6 F (37 C) 75 18 100 % 4\' 10"  (1.473 m) 214 lb (97.1 kg)    MAU Course  Procedures None  MDM Serial BPs CBC, CMP, UA and urine protein/creatinine ratio  Fern - negative   Assessment and Plan  A: SIUP at [redacted]w[redacted]d Membranes intact  CHTN with superimposed severe pre-eclampsia  P:  Admit for IOL  Kerry Hough, PA-C 05/14/2017, 11:49 PM

## 2017-05-14 NOTE — MAU Note (Signed)
Tomi Bamberger PA explained to pt and family the reason for induction and what to expect once on labor and delivery. Pt transferred via wheelchair.Marland Kitchen

## 2017-05-14 NOTE — MAU Note (Signed)
Leaking fld since 1700. Yellow fld. Ctxs since last wk with some back pain and abd pain today.

## 2017-05-15 ENCOUNTER — Inpatient Hospital Stay (HOSPITAL_COMMUNITY): Payer: Medicaid Other | Admitting: Anesthesiology

## 2017-05-15 ENCOUNTER — Inpatient Hospital Stay (HOSPITAL_COMMUNITY): Payer: Medicaid Other

## 2017-05-15 ENCOUNTER — Encounter (HOSPITAL_COMMUNITY): Payer: Self-pay

## 2017-05-15 LAB — CBC
HCT: 37.6 % (ref 36.0–46.0)
Hemoglobin: 12.8 g/dL (ref 12.0–15.0)
MCH: 31.1 pg (ref 26.0–34.0)
MCHC: 34 g/dL (ref 30.0–36.0)
MCV: 91.3 fL (ref 78.0–100.0)
PLATELETS: 364 10*3/uL (ref 150–400)
RBC: 4.12 MIL/uL (ref 3.87–5.11)
RDW: 14.4 % (ref 11.5–15.5)
WBC: 8.8 10*3/uL (ref 4.0–10.5)

## 2017-05-15 LAB — COMPREHENSIVE METABOLIC PANEL
ALBUMIN: 2.7 g/dL — AB (ref 3.5–5.0)
ALK PHOS: 138 U/L — AB (ref 38–126)
ALK PHOS: 139 U/L — AB (ref 38–126)
ALT: 174 U/L — ABNORMAL HIGH (ref 14–54)
ALT: 172 U/L — AB (ref 14–54)
AST: 117 U/L — AB (ref 15–41)
AST: 99 U/L — AB (ref 15–41)
Albumin: 2.6 g/dL — ABNORMAL LOW (ref 3.5–5.0)
Anion gap: 11 (ref 5–15)
Anion gap: 12 (ref 5–15)
BILIRUBIN TOTAL: 1.1 mg/dL (ref 0.3–1.2)
BUN: 6 mg/dL (ref 6–20)
BUN: 6 mg/dL (ref 6–20)
CALCIUM: 8.1 mg/dL — AB (ref 8.9–10.3)
CALCIUM: 8.1 mg/dL — AB (ref 8.9–10.3)
CHLORIDE: 102 mmol/L (ref 101–111)
CO2: 23 mmol/L (ref 22–32)
CO2: 22 mmol/L (ref 22–32)
CREATININE: 0.65 mg/dL (ref 0.44–1.00)
Chloride: 106 mmol/L (ref 101–111)
Creatinine, Ser: 0.6 mg/dL (ref 0.44–1.00)
GFR calc Af Amer: >60 mL/min (ref >60)
GFR calc non Af Amer: >60 mL/min (ref >60)
GLUCOSE: 97 mg/dL (ref 65–99)
Glucose, Bld: 114 mg/dL — ABNORMAL HIGH (ref 65–99)
POTASSIUM: 2.8 mmol/L — AB (ref 3.5–5.1)
Potassium: 3.1 mmol/L — ABNORMAL LOW (ref 3.5–5.1)
SODIUM: 136 mmol/L (ref 135–145)
Sodium: 140 mmol/L (ref 135–145)
TOTAL PROTEIN: 6.4 g/dL — AB (ref 6.5–8.1)
Total Bilirubin: 1.1 mg/dL (ref 0.3–1.2)
Total Protein: 6 g/dL — ABNORMAL LOW (ref 6.5–8.1)

## 2017-05-15 LAB — SAVE SMEAR: Smear Review: NONE SEEN

## 2017-05-15 LAB — PROTIME-INR
INR: 1.04
INR: 1.11
PROTHROMBIN TIME: 13.5 s (ref 11.4–15.2)
PROTHROMBIN TIME: 14.2 s (ref 11.4–15.2)

## 2017-05-15 LAB — RAPID URINE DRUG SCREEN, HOSP PERFORMED
AMPHETAMINES: NOT DETECTED
Barbiturates: NOT DETECTED
Benzodiazepines: NOT DETECTED
COCAINE: NOT DETECTED
OPIATES: NOT DETECTED
TETRAHYDROCANNABINOL: NOT DETECTED

## 2017-05-15 LAB — CBC WITH DIFFERENTIAL/PLATELET
BASOS PCT: 0 %
Basophils Absolute: 0 10*3/uL (ref 0.0–0.1)
EOS ABS: 0 10*3/uL (ref 0.0–0.7)
EOS PCT: 0 %
HCT: 36.6 % (ref 36.0–46.0)
Hemoglobin: 12.6 g/dL (ref 12.0–15.0)
LYMPHS ABS: 0.8 10*3/uL (ref 0.7–4.0)
Lymphocytes Relative: 10 %
MCH: 31.3 pg (ref 26.0–34.0)
MCHC: 34.4 g/dL (ref 30.0–36.0)
MCV: 90.8 fL (ref 78.0–100.0)
MONO ABS: 0.3 10*3/uL (ref 0.1–1.0)
MONOS PCT: 4 %
Neutro Abs: 6.5 10*3/uL (ref 1.7–7.7)
Neutrophils Relative %: 86 %
PLATELETS: 366 10*3/uL (ref 150–400)
RBC: 4.03 MIL/uL (ref 3.87–5.11)
RDW: 14.3 % (ref 11.5–15.5)
WBC: 7.6 10*3/uL (ref 4.0–10.5)

## 2017-05-15 LAB — APTT
APTT: 29 s (ref 24–36)
aPTT: 29 seconds (ref 24–36)

## 2017-05-15 LAB — FIBRINOGEN
FIBRINOGEN: 329 mg/dL (ref 210–475)
Fibrinogen: 353 mg/dL (ref 210–475)

## 2017-05-15 LAB — D-DIMER, QUANTITATIVE: D-Dimer, Quant: 2.49 ug/mL-FEU — ABNORMAL HIGH (ref 0.00–0.50)

## 2017-05-15 LAB — MAGNESIUM: Magnesium: 5 mg/dL — ABNORMAL HIGH (ref 1.7–2.4)

## 2017-05-15 LAB — PLATELET COUNT: PLATELETS: 352 10*3/uL (ref 150–400)

## 2017-05-15 MED ORDER — FENTANYL 2.5 MCG/ML BUPIVACAINE 1/10 % EPIDURAL INFUSION (WH - ANES)
14.0000 mL/h | INTRAMUSCULAR | Status: DC | PRN
  Administered 2017-05-15: 10 mL/h via EPIDURAL

## 2017-05-15 MED ORDER — DIPHENHYDRAMINE HCL 50 MG/ML IJ SOLN: 12.5000 mg | INTRAMUSCULAR | Status: DC | PRN

## 2017-05-15 MED ORDER — POTASSIUM CHLORIDE CRYS ER 20 MEQ PO TBCR
40.0000 meq | EXTENDED_RELEASE_TABLET | Freq: Three times a day (TID) | ORAL | Status: DC
  Administered 2017-05-15: 30 meq via ORAL
  Administered 2017-05-15: 40 meq via ORAL
  Filled 2017-05-15 (×5): qty 2

## 2017-05-15 MED ORDER — OXYTOCIN 40 UNITS IN LACTATED RINGERS INFUSION - SIMPLE MED
1.0000 m[IU]/min | INTRAVENOUS | Status: DC
  Administered 2017-05-15: 2 m[IU]/min via INTRAVENOUS

## 2017-05-15 MED ORDER — LACTATED RINGERS IV SOLN: 500.0000 mL | Freq: Once | INTRAVENOUS | Status: DC

## 2017-05-15 MED ORDER — PHENYLEPHRINE 40 MCG/ML (10ML) SYRINGE FOR IV PUSH (FOR BLOOD PRESSURE SUPPORT)
80.0000 ug | PREFILLED_SYRINGE | INTRAVENOUS | Status: DC | PRN
  Administered 2017-05-16 (×2): 80 ug via INTRAVENOUS
  Filled 2017-05-15: qty 10
  Filled 2017-05-15: qty 5
  Filled 2017-05-15: qty 10

## 2017-05-15 MED ORDER — EPHEDRINE 5 MG/ML INJ
10.0000 mg | INTRAVENOUS | Status: DC | PRN
  Filled 2017-05-15: qty 2
  Filled 2017-05-15: qty 4

## 2017-05-15 MED ORDER — PHENYLEPHRINE 40 MCG/ML (10ML) SYRINGE FOR IV PUSH (FOR BLOOD PRESSURE SUPPORT)
80.0000 ug | PREFILLED_SYRINGE | INTRAVENOUS | Status: DC | PRN
  Administered 2017-05-16: 80 ug via INTRAVENOUS
  Filled 2017-05-15: qty 5

## 2017-05-15 MED ORDER — FENTANYL 2.5 MCG/ML BUPIVACAINE 1/10 % EPIDURAL INFUSION (WH - ANES)
10.0000 mL/h | INTRAMUSCULAR | Status: DC | PRN
  Administered 2017-05-16: 10 mL/h via EPIDURAL
  Filled 2017-05-15 (×2): qty 100

## 2017-05-15 MED ORDER — PHENYLEPHRINE 40 MCG/ML (10ML) SYRINGE FOR IV PUSH (FOR BLOOD PRESSURE SUPPORT)
80.0000 ug | PREFILLED_SYRINGE | INTRAVENOUS | Status: DC | PRN
  Filled 2017-05-15: qty 5

## 2017-05-15 MED ORDER — EPHEDRINE 5 MG/ML INJ
10.0000 mg | INTRAVENOUS | Status: AC | PRN
  Administered 2017-05-16 (×2): 10 mg via INTRAVENOUS

## 2017-05-15 MED ORDER — FUROSEMIDE 10 MG/ML IJ SOLN
20.0000 mg | Freq: Once | INTRAMUSCULAR | Status: AC
  Administered 2017-05-15: 20 mg via INTRAVENOUS
  Filled 2017-05-15: qty 2

## 2017-05-15 NOTE — Progress Notes (Signed)
LABOR PROGRESS NOTE  Laura Gould is a 32 y.o. G1P0 at [redacted]w[redacted]d  admitted for IOL for CHTN with SIPE  Subjective: Patient feeling strong contractions Denies any other concerns.   Objective: BP (!) 153/79   Pulse 72   Temp 98.4 F (36.9 C) (Oral)   Resp 15   Ht 4\' 9"  (1.448 m)   Wt 214 lb (97.1 kg)   LMP 09/05/2016 (Approximate)   SpO2 (!) 75% Comment: pt appears to have sleep apnea, 75% on RA, O2 applied  BMI 46.31 kg/m  or  Vitals:   05/15/17 1140 05/15/17 1201 05/15/17 1231 05/15/17 1232  BP: (!) 158/71 (!) 143/79 (!) 165/74 (!) 153/79  Pulse: 73 74 75 72  Resp:      Temp:      TempSrc:      SpO2:    (!) 75%  Weight:      Height:        SVE: Dilation: Closed Effacement (%): 50 Station: -3 Presentation: Vertex Exam by:: Renesmay Nesbitt FHT: baseline rate 130, moderate varibility, +acel, no decel Toco: ctx q 2-6 min  Labs: Lab Results  Component Value Date   WBC 8.8 05/15/2017   HGB 12.8 05/15/2017   HCT 37.6 05/15/2017   MCV 91.3 05/15/2017   PLT 364 05/15/2017   CMP     Component Value Date/Time   NA 140 05/15/2017 0939   K 2.8 (L) 05/15/2017 0939   CL 106 05/15/2017 0939   CO2 23 05/15/2017 0939   GLUCOSE 97 05/15/2017 0939   BUN 6 05/15/2017 0939   CREATININE 0.60 05/15/2017 0939   CALCIUM 8.1 (L) 05/15/2017 0939   PROT 6.4 (L) 05/15/2017 0939   ALBUMIN 2.7 (L) 05/15/2017 0939   AST 117 (H) 05/15/2017 0939   ALT 174 (H) 05/15/2017 0939   ALKPHOS 138 (H) 05/15/2017 0939   BILITOT 1.1 05/15/2017 0939   GFRNONAA >60 05/15/2017 0939   GFRAA >60 05/15/2017 0939      Assessment / Plan: 32 y.o. G1P0 at [redacted]w[redacted]d here for IOL for CHTN with SIPE  SIPE/HELLP: on IV magnesium. BP not needed tx. Labs stable. Repeat at 1800 Hypokalemia: replacement ordered.  Labor: s/p cytotec. Now contracting regularly. Unable to place FB still as cervix closed. Will recheck in about 2-4 hours Fetal Wellbeing:  Cat I  Pain Control:  IV pain meds prn. May get epidural  in labor Anticipated MOD:  SVD  Gailen Shelter, MD 05/15/2017, 1:16 PM

## 2017-05-15 NOTE — Anesthesia Pain Management Evaluation Note (Signed)
  CRNA Pain Management Visit Note  Patient: Laura Gould, 32 y.o., female  "Hello I am a member of the anesthesia team at [facility]. We have an anesthesia team available at all times to provide care throughout the hospital, including epidural management and anesthesia for C-section. I don't know your plan for the delivery whether it a natural birth, water birth, IV sedation, nitrous supplementation, doula or epidural, but we want to meet your pain goals."   1.Was your pain managed to your expectations on prior hospitalizations?   No prior hospitalizations  2.What is your expectation for pain management during this hospitalization?     Uncertain - natural vs epidural.  3.How can we help you reach that goal? Nursing interventions for the moment.  Record the patient's initial score and the patient's pain goal.   Pain: 8  Pain Goal: 8 [facility] wants you to be able to say your pain was always managed very well.  Zaelyn Noack 05/15/2017

## 2017-05-15 NOTE — Anesthesia Preprocedure Evaluation (Signed)
Anesthesia Evaluation  Patient identified by MRN, date of birth, ID band Patient awake    Reviewed: Allergy & Precautions, NPO status , Patient's Chart, lab work & pertinent test results, reviewed documented beta blocker date and time   Airway Mallampati: III  TM Distance: >3 FB Neck ROM: Full    Dental no notable dental hx. (+) Teeth Intact   Pulmonary asthma ,   tachypneic  breath sounds clear to auscultation + decreased breath sounds      Cardiovascular hypertension, Pt. on medications and Pt. 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   Dental advisory given  Plan Discussed with: Anesthesiologist, CRNA and Surgeon  Anesthesia Plan Comments:         Anesthesia Quick Evaluation

## 2017-05-15 NOTE — Progress Notes (Signed)
Patient ID: Laura Gould, female   DOB: 31-Jan-1985, 32 y.o.   MRN: [id number] Comfortable with epidural Sleepy but arousable DTRs 3+ so doubt Mag toxicity  Vitals:   05/15/17 1916 05/15/17 1952 05/15/17 2001 05/15/17 2025  BP: (!) 103/56  140/71 (!) 150/81  Pulse: 69  66 69  Resp:      Temp:  98.2 F (36.8 C)    TempSrc:  Axillary    SpO2:    100%  Weight:      Height:       FHR stable UCs not tracing well  Cervix examined Scar tissue broken up successfully Cervix went from pinhole to 2cm  .Dilation: 2 Effacement (%): 100 Cervical Position: Anterior Station: -2 Presentation: Vertex Exam by:: Hansel Feinstein, CNM  Foley bulb inserted  Will continue to observe

## 2017-05-15 NOTE — Progress Notes (Signed)
Informed Derrill Memo, CNM that lab was unable to draw labs due to patients edema- will wait until next lab draw to get. Patient was given blood pressure meds in MAU- we will wait until tomorrow to resume. Due to patients frequent voiding and the inability to consistently trace FHR; patient and provider are ok with a foley catheter.   Juluis Mire, RN

## 2017-05-15 NOTE — Progress Notes (Signed)
Family spoke with this nurse outside of the patient's room and asked "when are y'all gonna stop all this and just do a C-section?"  This Probation officer discussed the risks of a c section and reiterated that we will always try our hardest for a vaginal delivery as long as it is safe for mom and baby.  Reassured family that baby looks reassuring on the monitor and everything is being done proactively for the patient to prevent her from getting sicker but I would discuss their possible request for a c-section with the physicians so that they can come in and speak with the patient about her options and make a plan.  Also stated that now that we have a second IV, labs can be drawn, we can attempt and epidural, and the pt will be much more comfortable during our induction attempts.

## 2017-05-15 NOTE — Progress Notes (Signed)
While in the patients room, I noticed a breathing pattern consistent with sleep apnea.  I put a pulse ox on the patient to find that during sleep, her room air oxygen saturation was 75%.  Pt easily arousable and saturations improved to the mid nineties while awake.  I talked with the patient and her family about a sleep apnea dx and the pt stated that she had never been diagnosed but her husband said "she always sleeps like that".  Pt was placed on 2L [place] and Dr Lambert Keto updated.  Degele suggested keeping 2LNC on especially while pt sleeps.

## 2017-05-15 NOTE — Progress Notes (Signed)
Dr. Ilda Basset at bedside.  Attempt foley bulb placement x 2 with speculum and stylet.  Unsuccessful.

## 2017-05-15 NOTE — Progress Notes (Signed)
Pt's physical condition continues to worsen.  +4 generalized pitting edema, especially in legs.  3 nurses needed to hold legs for vaginal exam due to swelling in upper legs.  Pt continues to need [place] at 2L.  When 02 is removed, pt desats to low 90s while awake.  Dr. Ilda Basset called to bedside to evaluate patient.

## 2017-05-15 NOTE — Progress Notes (Signed)
LABOR PROGRESS NOTE  Laura Gould is a 32 y.o. G1P0 at [redacted]w[redacted]d  admitted for IOL for CHTN with SIPE  Subjective: Central liked attempted by anesthesia, but unable to obtain. He was able to get another PIV and labs drawns.   Objective: BP (!) 141/76   Pulse 71   Temp 98.3 F (36.8 C) (Axillary)   Resp 17   Ht 4\' 9"  (1.448 m)   Wt 214 lb (97.1 kg)   LMP 09/05/2016 (Approximate)   SpO2 (!) 75% Comment: pt appears to have sleep apnea, 75% on RA, O2 applied  BMI 46.31 kg/m  or  Vitals:   05/15/17 1501 05/15/17 1601 05/15/17 1700 05/15/17 1801  BP: (!) 146/76 127/71  (!) 141/76  Pulse: 71 72  71  Resp: 16 18 16 17   Temp: 98.4 F (36.9 C) 98.3 F (36.8 C)    TempSrc: Oral Axillary    SpO2:      Weight:      Height:        Last SVE: Dilation: 1 Effacement (%): 90 Cervical Position: Anterior Station: -2 Presentation: Vertex Exam by:: dr. Ilda Basset  FHT: baseline rate 125, moderate varibility, +acel, no decel Toco: ctx q 6-8 min  Labs: Lab Results  Component Value Date   WBC 7.6 05/15/2017   HGB 12.6 05/15/2017   HCT 36.6 05/15/2017   MCV 90.8 05/15/2017   PLT 366 05/15/2017   PLT 352 05/15/2017   CMP     Component Value Date/Time   NA 136 05/15/2017 1744   K 3.1 (L) 05/15/2017 1744   CL 102 05/15/2017 1744   CO2 22 05/15/2017 1744   GLUCOSE 114 (H) 05/15/2017 1744   BUN 6 05/15/2017 1744   CREATININE 0.65 05/15/2017 1744   CALCIUM 8.1 (L) 05/15/2017 1744   PROT 6.0 (L) 05/15/2017 1744   ALBUMIN 2.6 (L) 05/15/2017 1744   AST 99 (H) 05/15/2017 1744   ALT 172 (H) 05/15/2017 1744   ALKPHOS 139 (H) 05/15/2017 1744   BILITOT 1.1 05/15/2017 1744   GFRNONAA >60 05/15/2017 1744   GFRAA >60 05/15/2017 1744      Assessment / Plan: 32 y.o. G1P0 at [redacted]w[redacted]d here for IOL for CHTN with SIPE  SIPE/HELLP: on IV magnesium. BP not needed tx. Repeat labs stable. Labor: s/p cytotec; FB attempted earlier by Dr. Ilda Basset, but unable to get. Will start IV Pitocin.  Plan to recheck labor; may need to breakup cervical scar tissue if not dilating.  Fetal Wellbeing:  Cat I  Pain Control:  IV pain meds prn. May get epidural Anticipated MOD:  SVD  Gailen Shelter, MD 05/15/2017, 6:21 PM

## 2017-05-15 NOTE — Progress Notes (Signed)
Laura Gould is a 32 y.o. G1P0 at [redacted]w[redacted]d admitted for induction of labor due to Hypertension.  Subjective: Patient was evaluated in the MAU earlier this evening for intermittent contractions and leakage of fluid. The patient was subsequently found to have elevated blood pressures consistent with severe pre-eclampsia. Blood pressure at this time is improved slightly. She is asymptomatic without headache, blurry vision, shortness of breath or RUQ abdominal pain. She does have significant diffuse edema in her extremities.   Objective: BP (!) 161/94   Pulse 73   Temp 98.1 F (36.7 C) (Oral)   Resp 18   Ht 4\' 9"  (1.448 m)   Wt 97.1 kg (214 lb)   LMP 09/05/2016 (Approximate)   SpO2 97%   BMI 46.31 kg/m  No intake/output data recorded. Total I/O In: 650.4 [P.O.:120; I.V.:280.4; IV Piggyback:250] Out: 800 [Urine:800]  FHT:  FHR: 130 bpm, variability: moderate,  accelerations:  Present,  decelerations:  Absent UC:   Irregulary, contractions seem to be about every 23min or os SVE:   Dilation: Closed Effacement (%): Thick Station: -3 Exam by:: Sheryle Hail, RN  Labs: Lab Results  Component Value Date   WBC 3.6 (L) 05/14/2017   HGB 15.3 (H) 05/14/2017   HCT 44.8 05/14/2017   MCV 91.4 05/14/2017   PLT 207 05/14/2017    Assessment / Plan: Admitted for induction of labor secondary to chronic hypertension with suspected superimposed pre-eclampsia with severe features.   Labor: Very low bishop score. Will start with cervical ripening using cytotec.  Preeclampsia:  on magnesium sulfate, no signs or symptoms of toxicity and blood pressures remained elevated, but patient is asymptomatic Fetal Wellbeing:  Category I Pain Control:  IV pain meds I/D:  GBS propylacti antibiotics while awaiting the results Anticipated MOD:  NSVD  Crissie Sickles 05/15/2017, 2:29 AM

## 2017-05-15 NOTE — Progress Notes (Signed)
Dr. Ilda Basset attempted to place foley bulb x 2 for cervical dilation. Unable to place at this time.

## 2017-05-15 NOTE — Progress Notes (Signed)
Consent signed for central line placement.  Dr. Royce Macadamia attempted CVC placement x 2 unsuccessful.  (See anesthesia note).  Pt requested to stop procedure.  Dr. Royce Macadamia then successfully placed an 18 g IV in the rt wrist.

## 2017-05-15 NOTE — Progress Notes (Signed)
Dr. Lambert Keto at bedside.  Attempt foley bulb.  Unsuccessful.  Will keep pt on 02 per degele.

## 2017-05-15 NOTE — Progress Notes (Signed)
L&D Note Went to see patient due to overall status Patient very edematous and appears more so than this morning. Pt also with o2 desats with sleeping but normal on RA when awake. Pt feeling UCs   Current Vital Signs 24h Vital Sign Ranges  T 98.4 F (36.9 C) Temp  Avg: 98.4 F (36.9 C)  Min: 98.1 F (36.7 C)  Max: 98.6 F (37 C)  BP (!) 146/76 BP  Min: 131/69  Max: 177/91  HR 71 Pulse  Avg: 72.4  Min: 58  Max: 81  RR 16 Resp  Avg: 17.2  Min: 15  Max: 20  SaO2 (!) 75 %(pt appears to have sleep apnea, 75% on RA, O2 applied) Nasal Cannula SpO2  Avg: 92.8 %  Min: 75 %  Max: 100 %       24 Hour I/O Current Shift I/O  Time Ins Outs 05/03 0701 - 05/04 0700 In: 1440.4 [P.O.:360; I.V.:780.4] Out: 3000 [Urine:3000] 05/04 0701 - 05/04 1900 In: -  Out: 2300 [Urine:2300]    Patient Vitals for the past 6 hrs:  BP Temp Temp src Pulse Resp SpO2  05/15/17 1501 (!) 146/76 98.4 F (36.9 C) Oral 71 16 -  05/15/17 1431 131/69 - - 70 16 -  05/15/17 1401 (!) 147/80 - - 72 18 -  05/15/17 1331 (!) 153/81 - - 72 18 -  05/15/17 1301 140/80 - - 74 18 -  05/15/17 1300 140/80 - - 74 - -  05/15/17 1232 (!) 153/79 - - 72 20 (!) 75 %  05/15/17 1231 (!) 165/74 - - 75 20 -  05/15/17 1201 (!) 143/79 - - 74 - -  05/15/17 1140 (!) 158/71 - - 73 - -  05/15/17 1131 (!) 169/76 - - 71 15 -  05/15/17 1102 (!) 142/85 98.4 F (36.9 C) Oral 79 17 -  05/15/17 1101 (!) 168/74 - - 75 16 -  05/15/17 1031 (!) 157/75 - - 73 - -  05/15/17 1001 139/80 - - 72 - -  UOP: approx 390mL blood tinged urine. There is small blood clot in the foley catheter  Category I +accels and ?q5-60m UCs Normal s1 and s2, no MRGs No resp distress. Pt not taking deep breaths but I don't hear anything abnormal.  Attempted to place FB/cook catheter with sterile speculum but very difficult due to body habitus and unable to put in dorsal lithotomy. Pt does appear to be SROM'ed with clear fluid. Tried to place bulbs x 2 but not in the proper location  and came out. SVE: cervix very anterior and a dimple but feels a rim/90-100% effaced   A/P: pt with worsening edema but overall stable Will have anesthesia come by to see and assess for any need for additional access and if she needs a central line.  I d/w her that I recommend getting an epidural given she's not mobile and for her comfort to have that on board now Will get a pCXR to rule out any pulmonary Lasix 20mg  IV already given  In terms of labor, I tried to break up her cervix, which could be post CKC scar tissue. If anesthesia okay with placing epidural, will wait and then start pitocin and reassess cervix in a few hours and draw lab work if they want it earlier than 1800.   Durene Romans MD Attending Center for [facility] (Faculty Practice) 05/15/2017 Time: [phone]

## 2017-05-15 NOTE — Progress Notes (Signed)
Dr. Royce Macadamia called to bedside to evaluate patient for possible CVC access.

## 2017-05-16 ENCOUNTER — Encounter (HOSPITAL_COMMUNITY): Payer: Self-pay | Admitting: Emergency Medicine

## 2017-05-16 DIAGNOSIS — Z3A36 36 weeks gestation of pregnancy: Secondary | ICD-10-CM

## 2017-05-16 DIAGNOSIS — O114 Pre-existing hypertension with pre-eclampsia, complicating childbirth: Secondary | ICD-10-CM

## 2017-05-16 LAB — CBC
HCT: 25.3 % — ABNORMAL LOW (ref 36.0–46.0)
HCT: 30.7 % — ABNORMAL LOW (ref 36.0–46.0)
HEMATOCRIT: 35.6 % — AB (ref 36.0–46.0)
Hemoglobin: 12.4 g/dL (ref 12.0–15.0)
Hemoglobin: 8.9 g/dL — ABNORMAL LOW (ref 12.0–15.0)
Hemoglobin: 10.9 g/dL — ABNORMAL LOW (ref 12.0–15.0)
MCH: 31.9 pg (ref 26.0–34.0)
MCH: 32.2 pg (ref 26.0–34.0)
MCH: 30.9 pg (ref 26.0–34.0)
MCHC: 34.8 g/dL (ref 30.0–36.0)
MCHC: 35.2 g/dL (ref 30.0–36.0)
MCHC: 35.5 g/dL (ref 30.0–36.0)
MCV: 91.5 fL (ref 78.0–100.0)
MCV: 91.7 fL (ref 78.0–100.0)
MCV: 87 fL (ref 78.0–100.0)
PLATELETS: 304 10*3/uL (ref 150–400)
Platelets: 346 10*3/uL (ref 150–400)
Platelets: 309 10*3/uL (ref 150–400)
RBC: 3.89 MIL/uL (ref 3.87–5.11)
RBC: 2.76 MIL/uL — AB (ref 3.87–5.11)
RBC: 3.53 MIL/uL — ABNORMAL LOW (ref 3.87–5.11)
RDW: 14.1 % (ref 11.5–15.5)
RDW: 14.2 % (ref 11.5–15.5)
RDW: 14.5 % (ref 11.5–15.5)
WBC: 6.4 10*3/uL (ref 4.0–10.5)
WBC: 15.4 10*3/uL — AB (ref 4.0–10.5)
WBC: 17.8 10*3/uL — ABNORMAL HIGH (ref 4.0–10.5)

## 2017-05-16 LAB — COMPREHENSIVE METABOLIC PANEL
ALK PHOS: 137 U/L — AB (ref 38–126)
ALT: 148 U/L — AB (ref 14–54)
ALT: 75 U/L — ABNORMAL HIGH (ref 14–54)
ANION GAP: 10 (ref 5–15)
AST: 72 U/L — ABNORMAL HIGH (ref 15–41)
AST: 37 U/L (ref 15–41)
Albumin: 2.6 g/dL — ABNORMAL LOW (ref 3.5–5.0)
Albumin: 2.2 g/dL — ABNORMAL LOW (ref 3.5–5.0)
Alkaline Phosphatase: 93 U/L (ref 38–126)
Anion gap: 11 (ref 5–15)
BILIRUBIN TOTAL: 1.4 mg/dL — AB (ref 0.3–1.2)
BUN: 6 mg/dL (ref 6–20)
BUN: 8 mg/dL (ref 6–20)
CALCIUM: 7.9 mg/dL — AB (ref 8.9–10.3)
CO2: 23 mmol/L (ref 22–32)
CO2: 25 mmol/L (ref 22–32)
CREATININE: 0.62 mg/dL (ref 0.44–1.00)
Calcium: 7.9 mg/dL — ABNORMAL LOW (ref 8.9–10.3)
Chloride: 100 mmol/L — ABNORMAL LOW (ref 101–111)
Chloride: 101 mmol/L (ref 101–111)
Creatinine, Ser: 0.63 mg/dL (ref 0.44–1.00)
GFR calc Af Amer: >60 mL/min (ref >60)
GFR calc non Af Amer: >60 mL/min (ref >60)
Glucose, Bld: 104 mg/dL — ABNORMAL HIGH (ref 65–99)
Glucose, Bld: 106 mg/dL — ABNORMAL HIGH (ref 65–99)
POTASSIUM: 3 mmol/L — AB (ref 3.5–5.1)
Potassium: 3.1 mmol/L — ABNORMAL LOW (ref 3.5–5.1)
SODIUM: 134 mmol/L — AB (ref 135–145)
Sodium: 136 mmol/L (ref 135–145)
TOTAL PROTEIN: 4.9 g/dL — AB (ref 6.5–8.1)
Total Bilirubin: 0.8 mg/dL (ref 0.3–1.2)
Total Protein: 5.9 g/dL — ABNORMAL LOW (ref 6.5–8.1)

## 2017-05-16 LAB — MAGNESIUM: Magnesium: 5.2 mg/dL — ABNORMAL HIGH (ref 1.7–2.4)

## 2017-05-16 LAB — PREPARE RBC (CROSSMATCH)

## 2017-05-16 LAB — RPR: RPR: NONREACTIVE

## 2017-05-16 MED ORDER — EPHEDRINE 5 MG/ML INJ
INTRAVENOUS | Status: AC
Start: 2017-05-16 — End: 2017-05-16
  Filled 2017-05-16: qty 10

## 2017-05-16 MED ORDER — MISOPROSTOL 200 MCG PO TABS
ORAL_TABLET | ORAL | Status: AC
  Administered 2017-05-16: 1000 ug
  Filled 2017-05-16: qty 5

## 2017-05-16 MED ORDER — ONDANSETRON HCL 4 MG/2ML IJ SOLN: 4.0000 mg | INTRAMUSCULAR | Status: DC | PRN

## 2017-05-16 MED ORDER — IBUPROFEN 600 MG PO TABS
600.0000 mg | ORAL_TABLET | Freq: Four times a day (QID) | ORAL | Status: DC
Start: 2017-05-16 — End: 2017-05-18
  Administered 2017-05-17 – 2017-05-18 (×3): 600 mg via ORAL
  Filled 2017-05-16 (×6): qty 1

## 2017-05-16 MED ORDER — PHENYLEPHRINE HCL 10 MG/ML IJ SOLN: 0.0000 ug/min | INTRAMUSCULAR | Status: DC

## 2017-05-16 MED ORDER — SODIUM CHLORIDE 0.9 % IV SOLN: Freq: Once | INTRAVENOUS | Status: DC | PRN

## 2017-05-16 MED ORDER — ALBUMIN HUMAN 5 % IV SOLN
INTRAVENOUS | Status: AC
  Filled 2017-05-16: qty 250

## 2017-05-16 MED ORDER — DIPHENHYDRAMINE HCL 25 MG PO CAPS
25.0000 mg | ORAL_CAPSULE | Freq: Four times a day (QID) | ORAL | Status: DC | PRN
Start: 2017-05-16 — End: 2017-05-18

## 2017-05-16 MED ORDER — MAGNESIUM SULFATE 40 G IN LACTATED RINGERS - SIMPLE
2.0000 g/h | INTRAVENOUS | Status: AC
  Administered 2017-05-16: 2 g/h via INTRAVENOUS
  Filled 2017-05-16: qty 500
  Filled 2017-05-16: qty 40

## 2017-05-16 MED ORDER — ALBUMIN HUMAN 5 % IV SOLN
12.5000 g | Freq: Once | INTRAVENOUS | Status: AC
  Administered 2017-05-16: 11:00:00 via INTRAVENOUS
  Filled 2017-05-16: qty 250

## 2017-05-16 MED ORDER — TETANUS-DIPHTH-ACELL PERTUSSIS 5-2.5-18.5 LF-MCG/0.5 IM SUSP: 0.5000 mL | Freq: Once | INTRAMUSCULAR | Status: DC

## 2017-05-16 MED ORDER — BENZOCAINE-MENTHOL 20-0.5 % EX AERO: 1.0000 "application " | INHALATION_SPRAY | CUTANEOUS | Status: DC | PRN

## 2017-05-16 MED ORDER — EPHEDRINE SULFATE 50 MG/ML IJ SOLN
INTRAMUSCULAR | Status: DC | PRN
  Administered 2017-05-16: 15 mg via INTRAVENOUS
  Administered 2017-05-16: 5 mg via INTRAVENOUS
  Administered 2017-05-16: 20 mg via INTRAVENOUS
  Administered 2017-05-16: 10 mg via INTRAVENOUS

## 2017-05-16 MED ORDER — OXYTOCIN BOLUS FROM INFUSION
500.0000 mL | Freq: Once | INTRAVENOUS | Status: AC
  Administered 2017-05-16: 999 mL/h via INTRAVENOUS

## 2017-05-16 MED ORDER — SODIUM CHLORIDE 0.9% FLUSH
3.0000 mL | Freq: Two times a day (BID) | INTRAVENOUS | Status: DC
  Administered 2017-05-17: 3 mL via INTRAVENOUS

## 2017-05-16 MED ORDER — ONDANSETRON HCL 4 MG PO TABS: 4.0000 mg | ORAL_TABLET | ORAL | Status: DC | PRN

## 2017-05-16 MED ORDER — SODIUM CHLORIDE 0.9% FLUSH: 3.0000 mL | INTRAVENOUS | Status: DC | PRN

## 2017-05-16 MED ORDER — SIMETHICONE 80 MG PO CHEW: 80.0000 mg | CHEWABLE_TABLET | ORAL | Status: DC | PRN

## 2017-05-16 MED ORDER — ALBUTEROL SULFATE HFA 108 (90 BASE) MCG/ACT IN AERS
1.0000 | INHALATION_SPRAY | Freq: Every day | RESPIRATORY_TRACT | Status: DC | PRN
Start: 2017-05-16 — End: 2017-05-16

## 2017-05-16 MED ORDER — COCONUT OIL OIL: 1.0000 "application " | TOPICAL_OIL | Status: DC | PRN

## 2017-05-16 MED ORDER — MAGNESIUM SULFATE 40 G IN LACTATED RINGERS - SIMPLE
2.0000 g/h | INTRAVENOUS | Status: DC
  Filled 2017-05-16: qty 500

## 2017-05-16 MED ORDER — EPHEDRINE 5 MG/ML INJ
INTRAVENOUS | Status: AC
  Filled 2017-05-16: qty 10

## 2017-05-16 MED ORDER — ZOLPIDEM TARTRATE 5 MG PO TABS: 5.0000 mg | ORAL_TABLET | Freq: Every evening | ORAL | Status: DC | PRN

## 2017-05-16 MED ORDER — PHENYLEPHRINE 40 MCG/ML (10ML) SYRINGE FOR IV PUSH (FOR BLOOD PRESSURE SUPPORT)
PREFILLED_SYRINGE | INTRAVENOUS | Status: AC
Start: 2017-05-16 — End: 2017-05-16
  Filled 2017-05-16: qty 40

## 2017-05-16 MED ORDER — MEASLES, MUMPS & RUBELLA VAC ~~LOC~~ INJ
0.5000 mL | INJECTION | Freq: Once | SUBCUTANEOUS | Status: DC
  Filled 2017-05-16: qty 0.5

## 2017-05-16 MED ORDER — LACTATED RINGERS IV SOLN
INTRAVENOUS | Status: AC
  Administered 2017-05-16: 16:00:00 via INTRAVENOUS

## 2017-05-16 MED ORDER — WITCH HAZEL-GLYCERIN EX PADS: 1.0000 "application " | MEDICATED_PAD | CUTANEOUS | Status: DC | PRN

## 2017-05-16 MED ORDER — FUROSEMIDE 10 MG/ML IJ SOLN
40.0000 mg | Freq: Two times a day (BID) | INTRAMUSCULAR | Status: DC
  Administered 2017-05-16: 40 mg via INTRAVENOUS
  Filled 2017-05-16 (×3): qty 4

## 2017-05-16 MED ORDER — POTASSIUM CHLORIDE CRYS ER 20 MEQ PO TBCR
40.0000 meq | EXTENDED_RELEASE_TABLET | Freq: Three times a day (TID) | ORAL | Status: DC
  Administered 2017-05-16 – 2017-05-18 (×5): 40 meq via ORAL
  Filled 2017-05-16 (×8): qty 2

## 2017-05-16 MED ORDER — LIDOCAINE HCL (PF) 1 % IJ SOLN
INTRAMUSCULAR | Status: DC | PRN
  Administered 2017-05-15: 3 mL via EPIDURAL
  Administered 2017-05-15: 4 mL via EPIDURAL

## 2017-05-16 MED ORDER — PHENYLEPHRINE HCL 10 MG/ML IJ SOLN
INTRAMUSCULAR | Status: DC | PRN
  Administered 2017-05-16: 160 ug via INTRAVENOUS
  Administered 2017-05-16: 80 ug via INTRAVENOUS
  Administered 2017-05-16: 160 ug via INTRAVENOUS
  Administered 2017-05-16: 80 ug via INTRAVENOUS

## 2017-05-16 MED ORDER — PRENATAL MULTIVITAMIN CH
1.0000 | ORAL_TABLET | Freq: Every day | ORAL | Status: DC
  Administered 2017-05-17 – 2017-05-18 (×2): 1 via ORAL
  Filled 2017-05-16 (×2): qty 1

## 2017-05-16 MED ORDER — TRANEXAMIC ACID 1000 MG/10ML IV SOLN
1000.0000 mg | INTRAVENOUS | Status: AC
  Administered 2017-05-16: 1000 mg via INTRAVENOUS
  Filled 2017-05-16: qty 1100

## 2017-05-16 MED ORDER — FUROSEMIDE 10 MG/ML IJ SOLN
40.0000 mg | Freq: Once | INTRAMUSCULAR | Status: AC
  Administered 2017-05-16: 40 mg via INTRAVENOUS
  Filled 2017-05-16: qty 4

## 2017-05-16 MED ORDER — SODIUM CHLORIDE 0.9 % IV SOLN
250.0000 mL | INTRAVENOUS | Status: DC | PRN
Start: 2017-05-16 — End: 2017-05-18

## 2017-05-16 MED ORDER — PHENYLEPHRINE 8 MG IN D5W 100 ML (0.08MG/ML) PREMIX OPTIME
30.0000 ug/min | INJECTION | INTRAVENOUS | Status: DC
  Administered 2017-05-16: 30 ug/min via INTRAVENOUS
  Filled 2017-05-16 (×68): qty 100

## 2017-05-16 MED ORDER — SODIUM CHLORIDE 0.9 % IV SOLN
Freq: Once | INTRAVENOUS | Status: AC
  Administered 2017-05-16: 13:00:00 via INTRAVENOUS

## 2017-05-16 MED ORDER — DIBUCAINE 1 % RE OINT: 1.0000 "application " | TOPICAL_OINTMENT | RECTAL | Status: DC | PRN

## 2017-05-16 MED ORDER — SENNOSIDES-DOCUSATE SODIUM 8.6-50 MG PO TABS
2.0000 | ORAL_TABLET | ORAL | Status: DC
  Filled 2017-05-16 (×2): qty 2

## 2017-05-16 MED ORDER — ACETAMINOPHEN 325 MG PO TABS
650.0000 mg | ORAL_TABLET | ORAL | Status: DC | PRN
  Administered 2017-05-16 – 2017-05-17 (×2): 650 mg via ORAL
  Filled 2017-05-16 (×2): qty 2

## 2017-05-16 MED ORDER — PHENYLEPHRINE 40 MCG/ML (10ML) SYRINGE FOR IV PUSH (FOR BLOOD PRESSURE SUPPORT)
PREFILLED_SYRINGE | INTRAVENOUS | Status: AC
  Filled 2017-05-16: qty 50

## 2017-05-16 MED FILL — Medication: Qty: 1 | Status: AC

## 2017-05-16 NOTE — Anesthesia Procedure Notes (Addendum)
Epidural Patient location during procedure: OB Start time: 05/15/2017 7:01 PM  Staffing Anesthesiologist: Josephine Igo, MD Performed: anesthesiologist   Preanesthetic Checklist Completed: patient identified, site marked, surgical consent, pre-op evaluation, timeout performed, IV checked, risks and benefits discussed and monitors and equipment checked  Epidural Patient position: sitting Prep: site prepped and draped and DuraPrep Patient monitoring: continuous pulse ox and blood pressure Approach: midline Location: L3-L4 Injection technique: LOR air  Needle:  Needle type: Tuohy  Needle gauge: 17 G Needle length: 9 cm and 9 Needle insertion depth: 6 cm Catheter type: closed end flexible Catheter size: 19 Gauge Catheter at skin depth: 11 cm Test dose: negative and Other  Assessment Events: blood not aspirated, injection not painful, no injection resistance, negative IV test and no paresthesia  Additional Notes Patient identified. Risks and benefits discussed including failed block, incomplete  Pain control, post dural puncture headache, nerve damage, paralysis, blood pressure Changes, nausea, vomiting, reactions to medications-both toxic and allergic and post Partum back pain. All questions were answered. Patient expressed understanding and wished to proceed. Sterile technique was used throughout procedure. Epidural site was Dressed with sterile barrier dressing. No paresthesias, signs of intravascular injection Or signs of intrathecal spread were encountered.  Patient was more comfortable after the epidural was dosed. Please see RN's note for documentation of vital signs and FHR which are stable.

## 2017-05-16 NOTE — Progress Notes (Signed)
Code Hemorrhage initiated.

## 2017-05-16 NOTE — Progress Notes (Signed)
Per order, arterial puncture of left radial artery for lab draw. Blood sample obtained with one stick using 23G Butterfly.

## 2017-05-16 NOTE — Progress Notes (Signed)
Patient ID: Laura Gould, female   DOB: 1985/03/10, 32 y.o.   MRN: [id number] Urine output has been low  Was 25cc, 50cc, 25cc Foley flushed  Consulted Dr Ilda Basset Will get labs and see how Cr looks  Will also check Mg level

## 2017-05-16 NOTE — Progress Notes (Signed)
Post Partum Day 0 Subjective: no complaints  Objective: Blood pressure 137/73, pulse 82, temperature 99.3 F (37.4 C), temperature source Oral, resp. rate 18, height 4\' 9"  (1.448 m), weight 97.1 kg (214 lb), last menstrual period 09/05/2016, SpO2 100 %, unknown if currently breastfeeding.  Physical Exam:  General: alert, cooperative and no distress Lochia: appropriate Uterine Fundus: firm Incision: n/a DVT Evaluation: No evidence of DVT seen on physical exam.  Recent Labs    05/16/17 0128 05/16/17 1120  HGB 12.4 8.9*  HCT 35.6* 25.3*    Intake/Output Summary (Last 24 hours) at 05/16/2017 1533 Last data filed at 05/16/2017 1442 Gross per 24 hour  Intake 5733.33 ml  Output 7775 ml  Net -2041.67 ml    Assessment/Plan: S/p PP hemorrhage with IV fluids, transfusion 2 units PRBC, cytotec, tranexamic acid and pitocin, stable now. Repeat labs and continue IV magnesium sulfate for 24 hr PP   LOS: 2 days   Emeterio Reeve 05/16/2017, 3:32 PM

## 2017-05-16 NOTE — Progress Notes (Signed)
Patient ID: Laura Gould, female   DOB: 11/15/1985, 32 y.o.   MRN: [id number] Foley out Vitals:   05/16/17 0030 05/16/17 0100 05/16/17 0101 05/16/17 0131  BP: (!) 142/79  132/77 135/66  Pulse: 65  67 67  Resp: 16  17 18   Temp:      TempSrc:      SpO2: 100% 98%    Weight:      Height:       FHR 115-120s Average variability UCs spaced out  Dilation: 4.5 Effacement (%): 100 Cervical Position: Anterior Station: -2, -3 Presentation: Vertex Exam by:: Lelan Pons, CNM  AROM clear/blood tinged fluid IUPC and ISE inserted  WIll increase pitocin

## 2017-05-16 NOTE — Anesthesia Postprocedure Evaluation (Signed)
Anesthesia Post Note  Patient: Laura Gould  Procedure(s) Performed: AN AD HOC LABOR EPIDURAL     Patient location during evaluation: Mother Baby Anesthesia Type: Epidural Level of consciousness: awake and alert, oriented and patient cooperative Pain management: pain level controlled Vital Signs Assessment: post-procedure vital signs reviewed and stable Respiratory status: spontaneous breathing Cardiovascular status: stable Postop Assessment: no headache, epidural receding, patient able to bend at knees and no signs of nausea or vomiting Anesthetic complications: no Comments: Pain score 1.    Last Vitals:  Vitals:   05/16/17 1730 05/16/17 1800  BP: (!) 125/53 (!) 116/56  Pulse: 79 79  Resp:    Temp:    SpO2:      Last Pain:  Vitals:   05/16/17 1520  TempSrc: Oral  PainSc: 0-No pain   Pain Goal: Patients Stated Pain Goal: 0 (05/14/17 1954)               Rico Sheehan

## 2017-05-17 ENCOUNTER — Other Ambulatory Visit: Payer: Self-pay

## 2017-05-17 ENCOUNTER — Other Ambulatory Visit: Payer: Medicaid Other

## 2017-05-17 ENCOUNTER — Encounter: Payer: Medicaid Other | Admitting: Obstetrics and Gynecology

## 2017-05-17 ENCOUNTER — Inpatient Hospital Stay (HOSPITAL_COMMUNITY): Payer: Medicaid Other

## 2017-05-17 DIAGNOSIS — I361 Nonrheumatic tricuspid (valve) insufficiency: Secondary | ICD-10-CM

## 2017-05-17 LAB — PREPARE FRESH FROZEN PLASMA: UNIT DIVISION: 0

## 2017-05-17 LAB — BPAM FFP
Blood Product Expiration Date: 201905102359
ISSUE DATE / TIME: 201905051310
Unit Type and Rh: 7300

## 2017-05-17 LAB — ECHOCARDIOGRAM COMPLETE
HEIGHTINCHES: 57 in
Weight: 3424 oz

## 2017-05-17 LAB — BPAM RBC
Blood Product Expiration Date: 201905172359
Blood Product Expiration Date: 201905182359
ISSUE DATE / TIME: 201905051224
ISSUE DATE / TIME: 201905051224
UNIT TYPE AND RH: 9500
Unit Type and Rh: 9500

## 2017-05-17 LAB — TYPE AND SCREEN
ABO/RH(D): B POS
Antibody Screen: NEGATIVE
UNIT DIVISION: 0
UNIT DIVISION: 0

## 2017-05-17 LAB — CULTURE, BETA STREP (GROUP B ONLY)

## 2017-05-17 MED ORDER — ENOXAPARIN SODIUM 60 MG/0.6ML ~~LOC~~ SOLN
0.5000 mg/kg | SUBCUTANEOUS | Status: DC
  Administered 2017-05-17 – 2017-05-18 (×2): 50 mg via SUBCUTANEOUS
  Filled 2017-05-17 (×3): qty 0.6

## 2017-05-17 NOTE — Progress Notes (Signed)
  Echocardiogram 2D Echocardiogram has been performed.  Laura Gould M 05/17/2017, 9:59 AM

## 2017-05-17 NOTE — Progress Notes (Signed)
Post Partum Day 1 Subjective: needs help getting out of bed  Objective: Blood pressure (!) 109/53, pulse 75, temperature 98.6 F (37 C), temperature source Oral, resp. rate 18, height 4\' 9"  (1.448 m), weight 97.1 kg (214 lb), last menstrual period 09/05/2016, SpO2 99 %, unknown if currently breastfeeding.  Physical Exam:  General: alert, cooperative and no distress Lochia: appropriate Uterine Fundus: firm Incision: n/a DVT Evaluation: No evidence of DVT seen on physical exam.  Recent Labs    05/16/17 1120 05/16/17 1730  HGB 8.9* 10.9*  HCT 25.3* 30.7*   CMP Latest Ref Rng & Units 05/16/2017 05/16/2017 05/15/2017  Glucose 65 - 99 mg/dL 106(H) 104(H) 114(H)  BUN 6 - 20 mg/dL 8 6 6   Creatinine 0.44 - 1.00 mg/dL 0.63 0.62 0.65  Sodium 135 - 145 mmol/L 136 134(L) 136  Potassium 3.5 - 5.1 mmol/L 3.0(L) 3.1(L) 3.1(L)  Chloride 101 - 111 mmol/L 101 100(L) 102  CO2 22 - 32 mmol/L 25 23 22   Calcium 8.9 - 10.3 mg/dL 7.9(L) 7.9(L) 8.1(L)  Total Protein 6.5 - 8.1 g/dL 4.9(L) 5.9(L) 6.0(L)  Total Bilirubin 0.3 - 1.2 mg/dL 1.4(H) 0.8 1.1  Alkaline Phos 38 - 126 U/L 93 137(H) 139(H)  AST 15 - 41 U/L 37 72(H) 99(H)  ALT 14 - 54 U/L 75(H) 148(H) 172(H)   CBC    Component Value Date/Time   WBC 17.8 (H) 05/16/2017 1730   RBC 3.53 (L) 05/16/2017 1730   HGB 10.9 (L) 05/16/2017 1730   HGB 11.6 03/17/2017 1112   HCT 30.7 (L) 05/16/2017 1730   HCT 34.8 03/17/2017 1112   PLT 309 05/16/2017 1730   PLT 315 03/17/2017 1112   MCV 87.0 05/16/2017 1730   MCV 91 03/17/2017 1112   MCH 30.9 05/16/2017 1730   MCHC 35.5 05/16/2017 1730   RDW 14.5 05/16/2017 1730   RDW 14.7 03/17/2017 1112   LYMPHSABS 0.8 05/15/2017 1744   MONOABS 0.3 05/15/2017 1744   EOSABS 0.0 05/15/2017 1744   BASOSABS 0.0 05/15/2017 1744    \ Assessment/Plan: D/c magnesium 1000   LOS: 3 days   Emeterio Reeve 05/17/2017, 6:56 AM

## 2017-05-17 NOTE — Progress Notes (Signed)
Faculty Practice OB/GYN Attending Note  Introduced myself to patient and her mother as the oncoming Attending of the week. Chart reviewed.  Patient is PPD#1 s/p SVD after IOL for CHTN with superimposed severe preeclampsia (BP, LFTs), delivery complicated by PPH requiring 2 units of RBCS.  It has been difficult to maintain IV access, draw labs; also had increased edema and has been diuresed extensively with Lasix. Normal BP recently. Vitals:   05/17/17 0400 05/17/17 0500 05/17/17 0600 05/17/17 0700  BP: (!) 109/53  (!) 114/58 117/63  Pulse: 75  72 73  Resp: 18 19 18  (!) 21  Temp: 98.6 F (37 C)     TempSrc: Oral     SpO2:      Weight:      Height:        Intake/Output Summary (Last 24 hours) at 05/17/2017 9295 Last data filed at 05/17/2017 0800 Gross per 24 hour  Intake 2738.91 ml  Output 10450 ml  Net -7711.09 ml    Plan: *Discontinue magnesium sulfate at 1000; monitor BP *Lovenox added for VTE prophylaxis *Patient missing Hep B and Rubella status, labs ordered for tomorrow *CBC, CMET also ordered for tomorrow;  *Enlarged cardiac silhouette seen on recent CXR; given CHTN, OSA she has increased risk of heart failure. ECHO ordered, will follow up results and manage accordingly.  BNP also ordered tomorrow *Routine postpartum care; BTL deferred until 4-6 weeks after delivery.   Verita Schneiders, MD, [place] for [facility], [place]

## 2017-05-17 NOTE — Lactation Note (Signed)
This note was copied from a baby's chart. Lactation Consultation Note  Patient Name: Laura Gould Date: 05/17/2017 Reason for consult: Initial assessment   P1 mom  Wishing to bottle feed her breastmilk.  Infant is 37 hours old and STS was delayed due to [facility].  Mom just had echo and has had excess fluid; pitting noted on outer quardrants of breasts.  Tissue very dense.  Mom states pain is 0 when massage prior to hand expression.  She says her breasts became like this when she started retaining fluid.  Baby LPT less than 6 lbs.  LPT information reviewed with mom and mat. Grandmother.    Baby is getting bottle fed 22 CAL.  Set mom up with DEBP and taught hand expression, (no colostrum seen), and mom pumped.  Reviewed pump set up and cleaning.    Wrote on board 8 boxes for mom to check off for 24 hour pumping times.  Mom understands importance of pumping in order to stimulate milk supply.  Pt. Encouraged to call out for questions or assistance with feeds.  Mom encouraged to hand express before and after pumps and snappies were provided for collecting colostrum.   Maternal Data Formula Feeding for Exclusion: No Has patient been taught Hand Expression?: Yes(unable to hand express any amount) Does the patient have breastfeeding experience prior to this delivery?: No  Feeding    LATCH Score                   Interventions Interventions: Breast feeding basics reviewed;Expressed milk;Hand express;DEBP;Breast massage;Skin to skin  Lactation Tools Discussed/Used WIC Program: [facility]) Pump Review: Setup, frequency, and cleaning Initiated by:: Rhea Bleacher Date initiated:: 05/17/17   Consult Status Consult Status: Follow-up Date: 05/18/17 Follow-up type: In-patient    [facility] 05/17/2017, 10:55 AM

## 2017-05-17 NOTE — Progress Notes (Signed)
Pt Sleeping

## 2017-05-18 LAB — COMPREHENSIVE METABOLIC PANEL
ALT: 49 U/L (ref 14–54)
AST: 34 U/L (ref 15–41)
Albumin: 2.3 g/dL — ABNORMAL LOW (ref 3.5–5.0)
Alkaline Phosphatase: 85 U/L (ref 38–126)
Anion gap: 11 (ref 5–15)
BILIRUBIN TOTAL: 0.6 mg/dL (ref 0.3–1.2)
BUN: 13 mg/dL (ref 6–20)
CHLORIDE: 100 mmol/L — AB (ref 101–111)
CO2: 27 mmol/L (ref 22–32)
CREATININE: 0.57 mg/dL (ref 0.44–1.00)
Calcium: 8 mg/dL — ABNORMAL LOW (ref 8.9–10.3)
Glucose, Bld: 75 mg/dL (ref 65–99)
POTASSIUM: 2.9 mmol/L — AB (ref 3.5–5.1)
Sodium: 138 mmol/L (ref 135–145)
TOTAL PROTEIN: 5.3 g/dL — AB (ref 6.5–8.1)

## 2017-05-18 LAB — CBC
HEMATOCRIT: 28.2 % — AB (ref 36.0–46.0)
Hemoglobin: 9.7 g/dL — ABNORMAL LOW (ref 12.0–15.0)
MCH: 30.3 pg (ref 26.0–34.0)
MCHC: 34.4 g/dL (ref 30.0–36.0)
MCV: 88.1 fL (ref 78.0–100.0)
Platelets: 333 10*3/uL (ref 150–400)
RBC: 3.2 MIL/uL — ABNORMAL LOW (ref 3.87–5.11)
RDW: 14.8 % (ref 11.5–15.5)
WBC: 9.9 10*3/uL (ref 4.0–10.5)

## 2017-05-18 LAB — BRAIN NATRIURETIC PEPTIDE: B Natriuretic Peptide: 23.4 pg/mL (ref 0.0–100.0)

## 2017-05-18 LAB — HEPATITIS B SURFACE ANTIGEN: HEP B S AG: NEGATIVE

## 2017-05-18 MED ORDER — AMLODIPINE BESYLATE 10 MG PO TABS
10.0000 mg | ORAL_TABLET | Freq: Every day | ORAL | Status: DC
  Administered 2017-05-18: 10 mg via ORAL
  Filled 2017-05-18: qty 1

## 2017-05-18 MED ORDER — AMLODIPINE BESYLATE 10 MG PO TABS: 10.0000 mg | ORAL_TABLET | Freq: Every day | ORAL | 3 refills | Status: DC

## 2017-05-18 MED ORDER — IBUPROFEN 600 MG PO TABS: 600.0000 mg | ORAL_TABLET | Freq: Four times a day (QID) | ORAL | 0 refills | Status: AC

## 2017-05-18 MED ORDER — POTASSIUM CHLORIDE CRYS ER 20 MEQ PO TBCR: 40.0000 meq | EXTENDED_RELEASE_TABLET | Freq: Every day | ORAL | 1 refills | Status: AC

## 2017-05-18 MED ORDER — FUROSEMIDE 20 MG PO TABS: 20.0000 mg | ORAL_TABLET | Freq: Every day | ORAL | 1 refills | Status: DC

## 2017-05-18 NOTE — Progress Notes (Signed)
Discharge instructions including symptoms of hypertension, postpartum care, medication changes and follow up appointments discussed.   Patient verbalized understanding.  Patient required assistance from mother to sign paperwork.  Required assistance with spelling of last name and formatting of date and time.

## 2017-05-18 NOTE — Discharge Instructions (Signed)
Vaginal Delivery, Care After °Refer to this sheet in the next few weeks. These instructions provide you with information about caring for yourself after vaginal delivery. Your health care provider may also give you more specific instructions. Your treatment has been planned according to current medical practices, but problems sometimes occur. Call your health care provider if you have any problems or questions. °What can I expect after the procedure? °After vaginal delivery, it is common to have: °· Some bleeding from your vagina. °· Soreness in your abdomen, your vagina, and the area of skin between your vaginal opening and your anus (perineum). °· Pelvic cramps. °· Fatigue. ° °Follow these instructions at home: °Medicines °· Take over-the-counter and prescription medicines only as told by your health care provider. °· If you were prescribed an antibiotic medicine, take it as told by your health care provider. Do not stop taking the antibiotic until it is finished. °Driving ° °· Do not drive or operate heavy machinery while taking prescription pain medicine. °· Do not drive for 24 hours if you received a sedative. °Lifestyle °· Do not drink alcohol. This is especially important if you are breastfeeding or taking medicine to relieve pain. °· Do not use tobacco products, including cigarettes, chewing tobacco, or e-cigarettes. If you need help quitting, ask your health care provider. °Eating and drinking °· Drink at least 8 eight-ounce glasses of water every day unless you are told not to by your health care provider. If you choose to breastfeed your baby, you may need to drink more water than this. °· Eat high-fiber foods every day. These foods may help prevent or relieve constipation. High-fiber foods include: °? Whole grain cereals and breads. °? Brown rice. °? Beans. °? Fresh fruits and vegetables. °Activity °· Return to your normal activities as told by your health care provider. Ask your health care provider  what activities are safe for you. °· Rest as much as possible. Try to rest or take a nap when your baby is sleeping. °· Do not lift anything that is heavier than your baby or 10 lb (4.5 kg) until your health care provider says that it is safe. °· Talk with your health care provider about when you can engage in sexual activity. This may depend on your: °? Risk of infection. °? Rate of healing. °? Comfort and desire to engage in sexual activity. °Vaginal Care °· If you have an episiotomy or a vaginal tear, check the area every day for signs of infection. Check for: °? More redness, swelling, or pain. °? More fluid or blood. °? Warmth. °? Pus or a bad smell. °· Do not use tampons or douches until your health care provider says this is safe. °· Watch for any blood clots that may pass from your vagina. These may look like clumps of dark red, brown, or black discharge. °General instructions °· Keep your perineum clean and dry as told by your health care provider. °· Wear loose, comfortable clothing. °· Wipe from front to back when you use the toilet. °· Ask your health care provider if you can shower or take a bath. If you had an episiotomy or a perineal tear during labor and delivery, your health care provider may tell you not to take baths for a certain length of time. °· Wear a bra that supports your breasts and fits you well. °· If possible, have someone help you with household activities and help care for your baby for at least a few days after   you leave the hospital.  Keep all follow-up visits for you and your baby as told by your health care provider. This is important. Contact a health care provider if:  You have: ? Vaginal discharge that has a bad smell. ? Difficulty urinating. ? Pain when urinating. ? A sudden increase or decrease in the frequency of your bowel movements. ? More redness, swelling, or pain around your episiotomy or vaginal tear. ? More fluid or blood coming from your episiotomy or  vaginal tear. ? Pus or a bad smell coming from your episiotomy or vaginal tear. ? A fever. ? A rash. ? Little or no interest in activities you used to enjoy. ? Questions about caring for yourself or your baby.  Your episiotomy or vaginal tear feels warm to the touch.  Your episiotomy or vaginal tear is separating or does not appear to be healing.  Your breasts are painful, hard, or turn red.  You feel unusually sad or worried.  You feel nauseous or you vomit.  You pass large blood clots from your vagina. If you pass a blood clot from your vagina, save it to show to your health care provider. Do not flush blood clots down the toilet without having your health care provider look at them.  You urinate more than usual.  You are dizzy or light-headed.  You have not breastfed at all and you have not had a menstrual period for 12 weeks after delivery.  You have stopped breastfeeding and you have not had a menstrual period for 12 weeks after you stopped breastfeeding. Get help right away if:  You have: ? Pain that does not go away or does not get better with medicine. ? Chest pain. ? Difficulty breathing. ? Blurred vision or spots in your vision. ? Thoughts about hurting yourself or your baby.  You develop pain in your abdomen or in one of your legs.  You develop a severe headache.  You faint.  You bleed from your vagina so much that you fill two sanitary pads in one hour. This information is not intended to replace advice given to you by your health care provider. Make sure you discuss any questions you have with your health care provider. Document Released: 12/27/1999 Document Revised: 06/12/2015 Document Reviewed: 01/13/2015 Elsevier Interactive Patient Education  2018 [employer].   Hypertension During Pregnancy Hypertension is also called high blood pressure. High blood pressure means that the force of your blood moving in your body is too strong. When you are pregnant,  this condition should be watched carefully. It can cause problems for you and your baby. Follow these instructions at home: Eating and drinking  Drink enough fluid to keep your pee (urine) clear or pale yellow.  Eat healthy foods that are low in salt (sodium). ? Do not add salt to your food. ? Check labels on foods and drinks to see much salt is in them. Look on the label where you see "Sodium." Lifestyle  Do not use any products that contain nicotine or tobacco, such as cigarettes and e-cigarettes. If you need help quitting, ask your doctor.  Do not use alcohol.  Avoid caffeine.  Avoid stress. Rest and get plenty of sleep. General instructions  Take over-the-counter and prescription medicines only as told by your doctor.  While lying down, lie on your left side. This keeps pressure off your baby.  While sitting or lying down, raise (elevate) your feet. Try putting some pillows under your lower legs.  Exercise  regularly. Ask your doctor what kinds of exercise are best for you.  Keep all prenatal and follow-up visits as told by your doctor. This is important. Contact a doctor if:  You have symptoms that your doctor told you to watch for, such as: ? Fever. ? Throwing up (vomiting). ? Headache. Get help right away if:  You have very bad pain in your belly (abdomen).  You are throwing up, and this does not get better with treatment.  You suddenly get swelling in your hands, ankles, or face.  You gain 4 lb (1.8 kg) or more in 1 week.  You get bleeding from your vagina.  You have blood in your pee.  You do not feel your baby moving as much as normal.  You have a change in vision.  You have muscle twitching or sudden tightening (spasms).  You have trouble breathing.  Your lips or fingernails turn blue. This information is not intended to replace advice given to you by your health care provider. Make sure you discuss any questions you have with your health care  provider. Document Released: 01/31/2010 Document Revised: 09/10/2015 Document Reviewed: 09/10/2015 Elsevier Interactive Patient Education  [employer].

## 2017-05-18 NOTE — Discharge Summary (Signed)
OB Discharge Summary     Patient Name: Laura Laura DOB: 15-Sep-1985 MRN: [id number]  Date of admission: 05/14/2017 Delivering MD: Laura Laura   Date of discharge: 05/18/2017  Admitting diagnosis: 38 WKS REFERRED BY DOCTOR SHORTNESS OF BREATH Intrauterine pregnancy: [redacted]w[redacted]Gould     Secondary diagnosis:  Principal Problem:   Chronic hypertension with superimposed severe pre-eclampsia Active Problems:   Supervision of high-risk pregnancy   Request for sterilization  Additional problems: None     Discharge diagnosis: Preterm Pregnancy Delivered, Preeclampsia (severe), CHTN with superimposed preeclampsia and PPH                                                                                                Post partum procedures: Blood transfusion with 2 units of pRBCs, magnesium sulfate, Cardiac ECHO  Augmentation: AROM, Pitocin, Cytotec and Foley Balloon  Complications: [id number]  Hospital course:  Induction of Labor With Vaginal Delivery   32 y.o. yo G1P0101 at [redacted]w[redacted]Gould was admitted to the hospital 05/14/2017 for induction of labor.  Indication for induction: CHTN with superimposed severe preeclampsia.  Patient had a labor course as follows: Membrane Rupture Time/Date: 3:45 PM ,05/15/2017   Intrapartum Procedures: Episiotomy: None [1]                                         Lacerations:  2nd degree [3];Perineal [11]  Patient had delivery of a Viable infant.  Information for the patient's newborn:  Laura Laura [[id number]]  Delivery Method: Vaginal, Vacuum (Extractor)(Filed from Delivery Summary) on  05/16/2017 at [place]. Details of delivery can be found in separate delivery note.  Patient had a PPH of about 2 L, treated with cytotec and TXA.  She subsequently received 2 units of pRBCs.  She received medications for BP control during labor and postpartum; had magnesium sulfate intrapartum and postpartum for eclampsia prophylaxis. Patient also had significant  edema and required Lasix diuresis.  She had a CXR that showed enlarged cardiac silhouette, follow up ECHO with normal EF.  On PPD#2, she was started on Norvasc 10 mg daily, and discharged to home with close follow up.   Physical exam  Vitals:   05/18/17 0445 05/18/17 0500 05/18/17 0949 05/18/17 1127  BP: (!) 150/92  (!) 145/90 (!) 154/89  Pulse: 77  81 85  Resp: 17  18 16   Temp: 98.3 F (36.8 C)  (!) 97.4 F (36.3 C) 98.7 F (37.1 C)  TempSrc: Oral  Oral Oral  SpO2: 98%  100% 99%  Weight:  192 lb (87.1 kg)    Height:       General: alert and no distress Lochia: appropriate Uterine Fundus: firm Incision: N/A DVT Evaluation: No evidence of DVT seen on physical exam. Calf/Ankle edema is present Labs: Lab Results  Component Value Date   WBC 9.9 05/18/2017   HGB 9.7 (L) 05/18/2017   HCT 28.2 (L) 05/18/2017   MCV 88.1 05/18/2017   PLT 333 05/18/2017   CMP Latest  Ref Rng & Units 05/18/2017  Glucose 65 - 99 mg/dL 75  BUN 6 - 20 mg/dL 13  Creatinine 0.44 - 1.00 mg/dL 0.57  Sodium 135 - 145 mmol/L 138  Potassium 3.5 - 5.1 mmol/L 2.9(L)  Chloride 101 - 111 mmol/L 100(L)  CO2 22 - 32 mmol/L 27  Calcium 8.9 - 10.3 mg/dL 8.0(L)  Total Protein 6.5 - 8.1 g/dL 5.3(L)  Total Bilirubin 0.3 - 1.2 mg/dL 0.6  Alkaline Phos 38 - 126 U/L 85  AST 15 - 41 U/L 34  ALT 14 - 54 U/L 49    Discharge instruction: per After Visit Summary and "Baby and Me Booklet".  After visit meds:  Allergies as of 05/18/2017   No Known Allergies     Medication List    STOP taking these medications   albuterol 108 (90 Base) MCG/ACT inhaler Commonly known as:  PROVENTIL HFA;VENTOLIN HFA   aspirin EC 81 MG tablet   labetalol 300 MG tablet Commonly known as:  NORMODYNE   NIFEdipine 30 MG 24 hr tablet Commonly known as:  PROCARDIA-XL/ADALAT CC   Prenatal Vitamins 0.8 MG tablet     TAKE these medications   amLODipine 10 MG tablet Commonly known as:  NORVASC Take 1 tablet (10 mg total) by mouth  daily. Start taking on:  05/19/2017   furosemide 20 MG tablet Commonly known as:  LASIX Take 1 tablet (20 mg total) by mouth daily.   ibuprofen 600 MG tablet Commonly known as:  ADVIL,MOTRIN Take 1 tablet (600 mg total) by mouth every 6 (six) hours.   potassium chloride SA 20 MEQ tablet Commonly known as:  K-DUR,KLOR-CON Take 2 tablets (40 mEq total) by mouth daily.       Diet: routine diet  Activity: Advance as tolerated. Pelvic rest for 6 weeks.   Outpatient follow up:1 week Follow up Appt: Future Appointments  Date Time Provider [place]  05/25/2017  9:00 AM [place] None  06/01/2017  9:00 AM Chancy Milroy, MD [place] None  06/14/2017  2:45 PM Constant, Vickii Chafe, MD [place] None   Follow up Visit:No follow-ups on file.  Postpartum contraception: Tubal Ligation desired  Newborn Data: Live born female  Birth Weight: 5 lb 4 oz (2381 g) APGAR: 8, 9  Newborn Delivery   Birth date/time:  05/16/2017 09:40:00 Delivery type:  Vaginal, Vacuum (Extractor)     Baby Feeding: Breast Disposition:home with mother   05/18/2017 Verita Schneiders, MD

## 2017-05-18 NOTE — Lactation Note (Addendum)
This note was copied from a baby's chart. Lactation Consultation Note  Patient Name: Laura Gould Date: 05/18/2017   Baby 13 hours old. [redacted]w[redacted]d. Mother has bilateral peau d'orange areas on her lower outside quadrants of her  breasts with hard areas, possible collection of breastmilk/fluid.   Mother is poor historian but thinks areas began prior to pregnancy.   Encouraged mother to discuss with her OB/GYN and passed information to RN.   Mother states she has not pumped since [place] worked with her yesterday and that she has "no milk." Demonstrated hand pump use which resulted in drops of breastmilk which were encouraging to mother. Provided education regarding how breastmilk comes to volume, supply and demand. Recommend mother pump q 3 hours.  Reviewed milk storage and engorgement care. Mom encouraged to feed baby 8-12 times/24 hours and with feeding cues at least q 3 hours.  Family is currently formula feeding.  Faxed loaner pump referral to [facility].       Maternal Data    Feeding    LATCH Score                   Interventions    Lactation Tools Discussed/Used     Consult Status      Carlye Grippe 05/18/2017, 11:54 AM

## 2017-05-19 LAB — RUBELLA SCREEN: RUBELLA: 1.08 {index} (ref >0.99)

## 2017-05-20 ENCOUNTER — Telehealth: Payer: Self-pay

## 2017-05-20 ENCOUNTER — Other Ambulatory Visit: Payer: Medicaid Other

## 2017-05-20 NOTE — Telephone Encounter (Signed)
Pt called with mother stating that BP was elevated this morning but she had only taken BP meds a few minutes ago, advised pt to retake BP and call back with results.

## 2017-05-28 ENCOUNTER — Ambulatory Visit: Payer: Medicaid Other

## 2017-05-28 VITALS — BP 136/85 | HR 92 | Wt 154.0 lb

## 2017-05-28 DIAGNOSIS — Z0130 Encounter for examination of blood pressure without abnormal findings: Secondary | ICD-10-CM

## 2017-05-28 MED ORDER — FUROSEMIDE 20 MG PO TABS: 20.0000 mg | ORAL_TABLET | Freq: Every day | ORAL | 1 refills | Status: AC

## 2017-05-28 NOTE — Progress Notes (Signed)
..  Subjective:  Laura Gould is a 32 y.o. female here for BP check.   Hypertension ROS: taking medications as instructed, no medication side effects noted, no TIA's, no chest pain on exertion, no dyspnea on exertion and no swelling of ankles.    Objective:  BP 136/85   Pulse 92   Wt 154 lb (69.9 kg)   LMP 09/05/2016 (Approximate)   BMI 33.33 kg/m   Appearance alert, well appearing, and in no distress. General exam BP noted to be well controlled today in office.    Assessment:   Blood Pressure well controlled.   Plan:  Current treatment plan is effective, no change in therapy.

## 2017-05-28 NOTE — Progress Notes (Signed)
Chart reviewed for nurse visit. Agree with plan of care.   Wende Mott, [place] 05/28/2017 10:47 AM

## 2017-06-01 ENCOUNTER — Ambulatory Visit (INDEPENDENT_AMBULATORY_CARE_PROVIDER_SITE_OTHER): Payer: Medicaid Other | Admitting: Obstetrics and Gynecology

## 2017-06-01 ENCOUNTER — Encounter: Payer: Self-pay | Admitting: Obstetrics and Gynecology

## 2017-06-01 VITALS — BP 137/98 | HR 90 | Wt 152.4 lb

## 2017-06-01 DIAGNOSIS — O119 Pre-existing hypertension with pre-eclampsia, unspecified trimester: Secondary | ICD-10-CM

## 2017-06-01 DIAGNOSIS — Z302 Encounter for sterilization: Secondary | ICD-10-CM

## 2017-06-01 DIAGNOSIS — R899 Unspecified abnormal finding in specimens from other organs, systems and tissues: Secondary | ICD-10-CM | POA: Insufficient documentation

## 2017-06-01 NOTE — Progress Notes (Signed)
Ms Laura Gould presents today for BP check and to discuss BTL. Pt is S/p IOL d/t CHTN SIPEC. Postpartum course complicated by PPH, received 2 units PRBC's. Magnesium x 24 hours. Placed on Norvasc ECHO nl EF  PPBTL was delayed d/t to the aforementioned problems.   Today she reports feeling a little sore o/w no compliants. Denies HA or visual changes. Swelling is improving. Unable to KDur d/t size of pill. Tolerating Norvasc. Bottle feeding. Bleeding deceasing No bowel or bladder dysfunction.  PE  AF BP 137/98 Lungs clear Heart RRR Abd soft + BS GU deferred Ext 1+ edema  A/P CHTN with [place]        Desire for sterilization  Will check K today. Continue with Norvasc. Lap BTL reviewed with pt. R/B/Post op care and failure rate reviewed.  Will schedule in June. Post OP/Postpartum visit 2 weeks after surgery. BTL papers signed 03/17/17 Pt and husband also considering Vasectomy. Husband to contact insurance about coverage and to see PCP about referral to Urology.

## 2017-06-02 ENCOUNTER — Ambulatory Visit (HOSPITAL_COMMUNITY): Payer: Medicaid Other

## 2017-06-03 ENCOUNTER — Other Ambulatory Visit: Payer: Self-pay

## 2017-06-03 MED ORDER — AMLODIPINE BESYLATE 10 MG PO TABS: 10.0000 mg | ORAL_TABLET | Freq: Every day | ORAL | 3 refills | Status: AC

## 2017-06-04 ENCOUNTER — Encounter (HOSPITAL_COMMUNITY): Payer: Self-pay

## 2017-06-14 ENCOUNTER — Other Ambulatory Visit: Payer: Self-pay

## 2017-06-14 ENCOUNTER — Ambulatory Visit (INDEPENDENT_AMBULATORY_CARE_PROVIDER_SITE_OTHER): Payer: Medicaid Other | Admitting: Obstetrics and Gynecology

## 2017-06-14 ENCOUNTER — Encounter: Payer: Self-pay | Admitting: Obstetrics and Gynecology

## 2017-06-14 DIAGNOSIS — Z1389 Encounter for screening for other disorder: Secondary | ICD-10-CM

## 2017-06-14 NOTE — Progress Notes (Signed)
Subjective:     Laura Gould is a 32 y.o. female who presents for a postpartum visit. She is 4 weeks postpartum following a spontaneous vaginal delivery. I have fully reviewed the prenatal and intrapartum course. The delivery was at 22 gestational weeks due to [facility] with superimposed preeclampsia. Outcome: spontaneous vaginal delivery. Anesthesia: epidural. Postpartum course has been Unremarkable. Baby's course has been Unremarkable. Baby is feeding by bottle [place]. Bleeding staining only. Bowel function is normal. Bladder function is normal. Patient is not sexually active. Contraception method is none. Postpartum depression screening: negative.     Review of Systems Pertinent items are noted in HPI.   Objective:    BP 128/86   Pulse 91   Wt 150 lb (68 kg)   Breastfeeding? No   BMI 32.46 kg/m   General:  alert, cooperative and no distress   Breasts:  inspection negative, no nipple discharge or bleeding, no masses or nodularity palpable  Lungs: clear to auscultation bilaterally  Heart:  regular rate and rhythm  Abdomen: soft, non-tender; bowel sounds normal; no masses,  no organomegaly   Vulva:  normal  Vagina: normal vagina, no discharge, exudate, lesion, or erythema  Cervix:  multiparous appearance  Corpus: normal size, contour, position, consistency, mobility, non-tender  Adnexa:  no mass, fullness, tenderness  Rectal Exam: Not performed.        Assessment:     Normal postpartum exam. Pap smear not done at today's visit- normal pap 06/28/2016.   Plan:    1. Contraception: abstinence until vasectomy 2. Patient is medically cleared to resume all activities of daily living. Patient normotensive on Norvasc. Encouraged to follow up with PCP for further management 3. Follow up in: 3 months for annual exam or as needed.

## 2017-06-21 ENCOUNTER — Ambulatory Visit: Admit: 2017-06-21 | Payer: Medicaid Other | Admitting: Obstetrics and Gynecology

## 2017-06-21 SURGERY — LIGATION, FALLOPIAN TUBE, LAPAROSCOPIC
Anesthesia: Choice | Laterality: Bilateral

## 2017-11-17 ENCOUNTER — Ambulatory Visit: Payer: Medicaid Other | Admitting: Obstetrics and Gynecology
